# Patient Record
Sex: Female | Born: 1975
Health system: Southern US, Community
[De-identification: ages and names within clinical notes are randomized; demographics above are authoritative.]

## PROBLEM LIST (undated history)

## (undated) DIAGNOSIS — F32A Depression, unspecified: Secondary | ICD-10-CM

## (undated) DIAGNOSIS — G35 Multiple sclerosis: Secondary | ICD-10-CM

## (undated) DIAGNOSIS — F329 Major depressive disorder, single episode, unspecified: Secondary | ICD-10-CM

## (undated) DIAGNOSIS — G35D Multiple sclerosis, unspecified: Secondary | ICD-10-CM

## (undated) HISTORY — DX: Multiple sclerosis, unspecified: G35.D

## (undated) HISTORY — DX: Depression, unspecified: F32.A

## (undated) HISTORY — DX: Multiple sclerosis: G35

## (undated) HISTORY — DX: Major depressive disorder, single episode, unspecified: F32.9

## (undated) HISTORY — PX: LIPOSUCTION: SHX10

---

## 2003-09-08 ENCOUNTER — Other Ambulatory Visit: Admission: RE | Admit: 2003-09-08 | Discharge: 2003-09-08 | Payer: Self-pay | Admitting: Family Medicine

## 2004-10-04 ENCOUNTER — Other Ambulatory Visit: Admission: RE | Admit: 2004-10-04 | Discharge: 2004-10-04 | Payer: Self-pay | Admitting: Family Medicine

## 2004-11-16 ENCOUNTER — Encounter: Admission: RE | Admit: 2004-11-16 | Discharge: 2004-11-16 | Payer: Self-pay | Admitting: Family Medicine

## 2004-12-02 ENCOUNTER — Encounter: Admission: RE | Admit: 2004-12-02 | Discharge: 2004-12-02 | Payer: Self-pay | Admitting: Neurology

## 2006-08-11 ENCOUNTER — Inpatient Hospital Stay (HOSPITAL_COMMUNITY): Admission: AD | Admit: 2006-08-11 | Discharge: 2006-08-13 | Payer: Self-pay | Admitting: *Deleted

## 2010-12-23 ENCOUNTER — Encounter: Payer: Self-pay | Admitting: Family Medicine

## 2011-02-19 ENCOUNTER — Other Ambulatory Visit: Payer: Self-pay | Admitting: Obstetrics & Gynecology

## 2011-02-19 DIAGNOSIS — N83299 Other ovarian cyst, unspecified side: Secondary | ICD-10-CM

## 2011-02-19 DIAGNOSIS — N83209 Unspecified ovarian cyst, unspecified side: Secondary | ICD-10-CM

## 2011-02-21 ENCOUNTER — Ambulatory Visit: Payer: Self-pay | Admitting: Family Medicine

## 2011-02-23 NOTE — Assessment & Plan Note (Signed)
NAME:  Laurie Cole, Laurie Cole              ACCOUNT NO.:  [id number]  MEDICAL RECORD NO.:  [id number]           PATIENT TYPE:  LOCATION:  [facility] at [place]           FACILITY:  PHYSICIAN:  Maylon Cos, CNM         DATE OF BIRTH:  DATE OF SERVICE:  02/19/2011                                 CLINIC NOTE  REASON FOR TODAY'S VISIT:  Followup of diagnosis of left ovarian cyst and to establish primary care provider.  HISTORY OF PRESENT ILLNESS:  The patient was seen in the emergency department at [facility] on February 14, 2011, secondary to complaints of pelvic pain and left lower quadrant pain.  She had a pelvic ultrasound at that time that showed a left ovarian cyst measuring 1.8 cm, also of significant amount on ultrasound she had a small fibroid in the left posterior fundus of the uterus measuring 2 cm and one in the right posterior fundus measuring 1.24 cm.  Endometrial stripe was 1 cm, within normal limits.  All of her labs were found to be within normal limits.  She was given prescription for Vicodin, which she has not taken any and told to follow up with her primary care provider.  She presents today with similar complaints that she notices more with movement or palpation.  Her activity over the weekend has been limited due to concerns of pain with exercise and intercourse, so she has not been involved in these activities.  The patient states that she has known about the small fibroid for quite some time, however, had never been diagnosed with an ovarian cyst.  MEDICAL HISTORY:  Allergies:  She has no known drug allergies.  Current medications are listed in her chart.  Health care maintenance:  She is up-to-date on all of her vaccinations.  Menstrual history:  First day of her last menstrual period was January 23, 2011.  Menarche began at age 70.  She has a regular cycles.  Her periods last 7-12 days, she describes them as a medium to light flow. She has mild  amount of pain and bleeding in between her periods.  Contraceptive history:  Her husband has not had a vasectomy.  Obstetrical history:  She is a gravida 1, para 1-0-0-1.  She has a son that is 66-1/2 years old, uncomplicated pregnancy and vaginal delivery.  Gynecologic history:  Her last Pap smear was in October 2011, it was normal.  She has a history of an abnormal Pap smear in 2001 without cryo, LEEP, or colpo.  It was treated with repeat Pap.  They had all been normal since.  STD History:  Negative.  Personal medical history:  Positive for irritable bowel syndrome and multiple sclerosis.  She was diagnosed with MS approximately 6 years ago and has been under the care of [facility].  SURGICAL HISTORY:  Negative.  SOCIAL HISTORY:  She is married.  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  She lives with her husband her son.  She is a nonsmoker.  She is a social drinker drinking less than 1 alcoholic beverage per week.  She denies any illicit drug use, sexual abuse, or physical  abuse.  She has had one partner in the last year.  FAMILY HISTORY:  Positive for diabetes in her maternal grandfather.  REVIEW OF SYSTEMS:  Systemic review is negative other than what has already been reviewed in HPI.  PHYSICAL EXAMINATION:  GENERAL:  This is a pleasant Caucasian female who appears to be her stated age of 35.  She is in no apparent distress. HEENT:  Grossly normal with good dentition. ABDOMEN:  Soft and nontender without masses.  No hepatosplenomegaly. PELVIC:  Bimanual exam reveals nonenlarged and nontender uterus, nonenlarged and nontender adnexa.  ASSESSMENT: 1. Left ovarian cyst. 2. Small uterine fibroids. 3. Multiple sclerosis.  PLAN: 1. Treatment options have been discussed with the patient for ovarian     cysts including expectant management with no treatment versus oral     contraceptive pills.  Given the  patient's long menses, she is     interested in oral contraceptive pills and we will start those with     her next cycle. 2. Repeat ultrasound in 6-8 weeks for resolution of her ovarian cyst.     The patient should follow up in approximately 1 week after her     ultrasound to review her results and as well as her symptoms and     cycles on her Loestrin 24.  She has been given a sample of pill     pack today along with a prescription with refills. 3. Pain management.  She is not interested in narcotic pain     medications.  She states that she tore up the prescription that was     given in the hospital.  Give her a prescription for diclofenac 75     mg 1 p.o. b.i.d. as needed for pain.  The patient should follow up     in approximately 1 week after her repeat pelvic ultrasound in 6     weeks or p.r.n. problems.          ______________________________ Maylon Cos, CNM    SS/MEDQ  D:  02/19/2011  T:  02/20/2011  Job:  [id number]

## 2011-03-14 ENCOUNTER — Ambulatory Visit: Payer: Self-pay | Admitting: Family Medicine

## 2011-03-21 ENCOUNTER — Ambulatory Visit (INDEPENDENT_AMBULATORY_CARE_PROVIDER_SITE_OTHER): Payer: PRIVATE HEALTH INSURANCE | Admitting: Family Medicine

## 2011-03-21 ENCOUNTER — Encounter: Payer: Self-pay | Admitting: Family Medicine

## 2011-03-21 DIAGNOSIS — R5383 Other fatigue: Secondary | ICD-10-CM

## 2011-03-21 DIAGNOSIS — Z130 Encounter for screening for diseases of the blood and blood-forming organs and certain disorders involving the immune mechanism: Secondary | ICD-10-CM

## 2011-03-21 DIAGNOSIS — Z1329 Encounter for screening for other suspected endocrine disorder: Secondary | ICD-10-CM

## 2011-03-21 DIAGNOSIS — R5381 Other malaise: Secondary | ICD-10-CM

## 2011-03-21 DIAGNOSIS — G35 Multiple sclerosis: Secondary | ICD-10-CM

## 2011-03-21 DIAGNOSIS — E559 Vitamin D deficiency, unspecified: Secondary | ICD-10-CM

## 2011-03-21 DIAGNOSIS — Z13220 Encounter for screening for lipoid disorders: Secondary | ICD-10-CM

## 2011-03-21 DIAGNOSIS — G35D Multiple sclerosis, unspecified: Secondary | ICD-10-CM

## 2011-03-21 NOTE — Progress Notes (Signed)
Subjective:    Patient ID: Laurie Cole, female    DOB: [birth year], 35 y.o.   MRN: [id number]  HPI 35 yo WF presents for NOV.  She has had relapsing - remitting MS x 6 yrs now, on Tysabri per Dr Leilani Merl in [facility].  Doing well.  Mood usually well controlled by low dose of Prozac daily.  She reports that last month, she had to go to [facility] for pelvic pain and was diagnosed with an ovarian cyst.  This was her first bout with a cyst and she had not previously been on birth control.  Her periods are usually normal and her husband had a vasectomy.  She went to gyn down the hall and was put on Loestrin to prevent new ovarian cysts and over time, her pain did resolve.  She is set up to repeat her u/s in another wk but she feels worse on the OCPs, more moody and more tired.  Denies anything else going on such as added stressors or flare in her MS that would cause this.     BP 112/72  Pulse 77  Ht 5' 3.75" (1.619 m)  Wt 140 lb (63.504 kg)  BMI 24.22 kg/m2  SpO2 100%  LMP 02/28/2011 BP 112/72  Pulse 77  Ht 5' 3.75" (1.619 m)  Wt 140 lb (63.504 kg)  BMI 24.22 kg/m2  SpO2 100%  LMP 02/28/2011  Past Medical History  Diagnosis Date  . Depression   . MS (multiple sclerosis)     sees Dr Leilani Merl in [facility]    History reviewed. No pertinent past surgical history.  Family History  Problem Relation Age of Onset  . Drug abuse Mother   . Arthritis Father     History   Social History  . Marital Status: Married    Spouse Name: Fayrene Fearing    Number of Children: 1  . 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 Smoking status: Never Smoker   . Smokeless tobacco: Not on file  . Alcohol Use: 0.5 oz/week    1 drink(s) per week  . Drug Use: No  . Sexually Active: Yes -- Female partner(s)    Birth Control/ Protection: Surgical   Other Topics Concern  . Not on file   Social History Narrative  . No narrative on file    No Known Allergies  Current  outpatient prescriptions:amphetamine-dextroamphetamine (ADDERALL) 5 MG tablet, Take 5 mg by mouth daily.  , Disp: , Rfl: ;  cetirizine (ZYRTEC) 10 MG tablet, Take 10 mg by mouth daily.  , Disp: , Rfl: ;  FLUoxetine (PROZAC) 10 MG capsule, Take 10 mg by mouth daily.  , Disp: , Rfl: ;  fluticasone (FLONASE) 50 MCG/ACT nasal spray, 2 sprays by Nasal route daily.  , Disp: , Rfl: ;  Natalizumab (TYSABRI IV), Inject into the vein.  , Disp: , Rfl:  Norethindrone Acet-Ethinyl Est (LOESTRIN 1.5/30, 21,) 1.5-30 MG-MCG TABS, Take by mouth.  , Disp: , Rfl:     Review of Systems  Constitutional: Positive for fatigue. Negative for fever and unexpected weight change.  HENT: Negative for congestion.   Respiratory: Negative for shortness of breath.   Cardiovascular: Negative for chest pain, palpitations and leg swelling.  Gastrointestinal: Negative for nausea, vomiting, abdominal pain, diarrhea and constipation.  Genitourinary: Negative for pelvic pain.  Psychiatric/Behavioral: Positive for dysphoric mood. Negative for sleep disturbance. The patient is nervous/anxious.  Objective:   Physical Exam  Constitutional: She appears well-developed and well-nourished. No distress.  HENT:  Head: Normocephalic and atraumatic.  Eyes: Conjunctivae are normal.  Neck: Neck supple. No thyromegaly present.  Cardiovascular: Normal rate, regular rhythm and normal heart sounds.   No murmur heard. Pulmonary/Chest: Effort normal.  Abdominal: Soft. Bowel sounds are normal. There is no tenderness.  Musculoskeletal: She exhibits no edema.  Lymphadenopathy:    She has no cervical adenopathy.  Skin: Skin is warm and dry.  Psychiatric: She has a normal mood and affect.          Assessment & Plan:  Fatigue - possibly from new start OCPs.  Will complete this wk and then STOP them.  We discussed that she may have another cyst OFF the pills but since she had previously never had an ovarian cyst, she opts to watch and  can use Advil and heating pad for mild to moderate pelvic pain in the future.  She is to f/u for her repeat u/s as scheduled and will look for fatigue and irritability to resolve off the pills.  Update labs to r/o other causes.

## 2011-03-21 NOTE — Patient Instructions (Signed)
Update fasting labs downstairs one morning.   Will call you w/ results.  Stop Birth control pills.

## 2011-03-28 LAB — CBC WITH DIFFERENTIAL/PLATELET
HCT: 37.5 % (ref 36.0–46.0)
Hemoglobin: 13.1 g/dL (ref 12.0–15.0)
Lymphocytes Relative: 47 % — ABNORMAL HIGH (ref 12–46)
Lymphs Abs: 3.4 10*3/uL (ref 0.7–4.0)
MCH: 34.4 pg — ABNORMAL HIGH (ref 26.0–34.0)
MCHC: 35.1 g/dL (ref 30.0–36.0)
MCV: 98.2 fL (ref 78.0–100.0)
Monocytes Absolute: 0.4 10*3/uL (ref 0.1–1.0)
Monocytes Relative: 5 % (ref 3–12)
Neutro Abs: 3.4 10*3/uL (ref 1.7–7.7)
Neutrophils Relative %: 48 % (ref 43–77)
Platelets: 215 10*3/uL (ref 150–400)
RBC: 3.81 MIL/uL — ABNORMAL LOW (ref 3.87–5.11)
RDW: 13.8 % (ref 11.5–15.5)
WBC: 7.1 10*3/uL (ref 4.0–10.5)

## 2011-03-29 ENCOUNTER — Telehealth: Payer: Self-pay | Admitting: Family Medicine

## 2011-03-29 LAB — COMPLETE METABOLIC PANEL WITH GFR
Albumin: 4.5 g/dL (ref 3.5–5.2)
BUN: 8 mg/dL (ref 6–23)
CO2: 25 mEq/L (ref 19–32)
GFR, Est African American: >60 mL/min (ref >60)
GFR, Est Non African American: >60 mL/min (ref >60)
Glucose, Bld: 86 mg/dL (ref 70–99)
Sodium: 139 mEq/L (ref 135–145)
Total Bilirubin: 0.8 mg/dL (ref 0.3–1.2)
Total Protein: 6.9 g/dL (ref 6.0–8.3)

## 2011-03-29 LAB — LIPID PANEL
Cholesterol: 206 mg/dL — ABNORMAL HIGH (ref 0–200)
HDL: 52 mg/dL (ref >39)

## 2011-03-29 LAB — TSH: TSH: 2.232 u[IU]/mL (ref 0.350–4.500)

## 2011-03-29 NOTE — Telephone Encounter (Signed)
Pt aware of the above  

## 2011-03-29 NOTE — Telephone Encounter (Signed)
Pls let pt know that her blood counts, thyroid function, vitamin D level, fasting sugar, liver and kidney function came back normal.  Cholesterol looks ok.  Ferritin is elevated -- this can be high with inflammation.  Fax copy to her neurologist in [place] -- Dr Leilani Merl.  Have her come in to repeat ferritin with iron, TIBC in 3 wks (lab only)

## 2011-04-02 ENCOUNTER — Other Ambulatory Visit: Payer: PRIVATE HEALTH INSURANCE

## 2011-04-09 ENCOUNTER — Ambulatory Visit: Payer: PRIVATE HEALTH INSURANCE

## 2011-05-28 ENCOUNTER — Ambulatory Visit
Admission: RE | Admit: 2011-05-28 | Discharge: 2011-05-28 | Disposition: A | Discharge status: Home / Self Care | Payer: PRIVATE HEALTH INSURANCE | Source: Ambulatory Visit | Attending: Obstetrics & Gynecology | Admitting: Obstetrics & Gynecology

## 2011-05-28 DIAGNOSIS — N83299 Other ovarian cyst, unspecified side: Secondary | ICD-10-CM

## 2011-05-29 ENCOUNTER — Ambulatory Visit (INDEPENDENT_AMBULATORY_CARE_PROVIDER_SITE_OTHER): Payer: PRIVATE HEALTH INSURANCE | Admitting: Obstetrics & Gynecology

## 2011-05-29 DIAGNOSIS — N803 Endometriosis of pelvic peritoneum: Secondary | ICD-10-CM

## 2011-05-29 DIAGNOSIS — N949 Unspecified condition associated with female genital organs and menstrual cycle: Secondary | ICD-10-CM

## 2011-05-30 ENCOUNTER — Other Ambulatory Visit: Payer: PRIVATE HEALTH INSURANCE

## 2011-05-30 NOTE — Assessment & Plan Note (Signed)
NAME:  Laurie Cole, Laurie Cole              ACCOUNT NO.:  [id number]  MEDICAL RECORD NO.:  [id number]           PATIENT TYPE:  LOCATION:  [facility] [place]           FACILITY:  PHYSICIAN:  Elsie Lincoln, MD           DATE OF BIRTH:  DATE OF SERVICE:  05/29/2011                                 CLINIC NOTE  The patient is a 35 year old female who presents for followup of pelvic pain.  The pain is mostly in the lower left quadrant.  She had a sonogram done at the ER in [place] at the [facility] and her ultrasound was essentially normal.  There were uterus measures 8.4 x 3.9 x 4.8 cm contains with a 2-cm left posterior fundal fibroid and 1.4 cm right posterior fundal fibroid.  Her endometrial stripe was 1 cm.  The right ovary was 3.2 x 1.7 x 2.9, the left ovary was 3.7 x 1.8 x 4.3, and showed a 1.8 cm follicular cyst which would be normal.  The patient had a followup ultrasound done at [facility] [place], but however, was not read by one of our GYN [facility].  The uterus measures 9.2 x 4.3 x 6.5.  Endometrium 5.2 mm.  No fibroids noted.  Right ovary measures 3.8 x 2.2 x 1.8 with several small follicles noted.  There is a small parovarian/exophytic cyst or follicle measuring 2.4 x 1.3 x 1.9.  Left ovary measures 2.9 x 1.2 x 3.3.  There is a small parovarian/exophytic cyst or follicle measuring 1.5 x 0.9 x 1.0.  No suspicion adnexal findings.  I am slightly confused about the differential of the paraovarian/exophytic cyst or follicle.  I am going to have Dr. Kyung Rudd and Dr. Avel Peace to re-read this and also compared to her films from the [facility] visit.  I will call the patient with those results.  The patient just started back on oral contraceptives.  I told her that this will be good to suppress ovarian cyst, also treatment for endometriosis.  She does have a complaint of constipation and she has irritable bowel syndrome and is more constipated than diarrhea.   She needs to get back involve with the gastroenterologist, I recommend the one in [place] the gastro  specialists.  She will be just check with her insurance.  I recommended that she takes MiraLax every day and does become completely regular and continue the oral contraceptives.  PAST MEDICAL HISTORY:  Multiple sclerosis, irritable bowel syndrome.  OB/GYN HISTORY:  As above and no other changes since her last visit in March.  MENSTRUAL HISTORY:  She did have some irregular bleeding with certain oral contraceptives.  MEDICATIONS:  See med rec.  No changes since March visit.  SURGICAL HISTORY:  Negative.  ALLERGIES:  None.  PHYSICAL EXAMINATION:  VITAL SIGNS:  Pulse 86, blood pressure 110/74, weight 141. GENERAL:  Well-nourished, well-developed, in no apparent distress. HEENT:  Normocephalic, atraumatic. CHEST:  Normal movement. ABDOMEN:  Soft, nontender.  No rebound or guarding. GENITALIA:  Tanner V.  Vagina pink, normal rugae.  Cervix closed, nontender.  Uterus anteverted, nontender.  Vaginal sidewalls nontender. No cystocele, no rectocele, and no urethrocele.  No pain over the bladder.  No pain over the rectum.  No pain on the uterus.  Right ovary is easily palpated, nontender.  Left ovary is slightly more difficult to palpate, but once found she is not tender over this area.  If we pushed towards the pelvic sidewall up into the left apex of the vagina, she is very tender in this area.  No stool saw on the rectum.  ASSESSMENT/PLAN:  A 35 year old female with left-sided pelvic pain. 1. Continue oral contraceptives. 2. We will get films from [facility] and have the radiologist to     compare this to the one done on May 28, 2011 at [facility]. 3. Discussed diagnosis of endometriosis. 4. It is still not better in 2-3 months. 5. Continue pelvic physical therapy.          ______________________________ Elsie Lincoln, MD    KL/MEDQ  D:  05/29/2011  T:  05/30/2011   Job:  [id number]

## 2011-08-08 ENCOUNTER — Telehealth: Payer: Self-pay | Admitting: *Deleted

## 2011-08-08 DIAGNOSIS — Z130 Encounter for screening for diseases of the blood and blood-forming organs and certain disorders involving the immune mechanism: Secondary | ICD-10-CM

## 2011-08-08 NOTE — Telephone Encounter (Signed)
Labs ordered.

## 2011-08-17 ENCOUNTER — Encounter: Payer: Self-pay | Admitting: Family Medicine

## 2011-08-17 ENCOUNTER — Ambulatory Visit (INDEPENDENT_AMBULATORY_CARE_PROVIDER_SITE_OTHER): Payer: PRIVATE HEALTH INSURANCE | Admitting: Family Medicine

## 2011-08-17 DIAGNOSIS — K589 Irritable bowel syndrome without diarrhea: Secondary | ICD-10-CM

## 2011-08-17 DIAGNOSIS — G35 Multiple sclerosis: Secondary | ICD-10-CM | POA: Insufficient documentation

## 2011-08-17 NOTE — Assessment & Plan Note (Signed)
Dong well on her miralax regimen. If not consider amitiza.

## 2011-08-17 NOTE — Progress Notes (Signed)
  Subjective:    Patient ID: Laurie Cole, female    DOB: July 05, 1976, 35 y.o.   MRN: [id number]  HPI She was new pt with Dr. Cathey Cole and saw her for stomach issues.  Pain was in the LLQ. Saw Dr. Penne Cole.  Possible endometriosis.  She has been eating well and taking miralax every day adn that seem to be really helping her.  Tends to have IBS- constipation predominant.     Review of Systems     BP 113/63  Pulse 78  Wt 141 lb (63.957 kg)    No Known Allergies  Past Medical History  Diagnosis Date  . Depression   . MS (multiple sclerosis)     sees Dr Laurie Cole in [facility]    No past surgical history on file.  History   Social History  . Marital Status: Married    Spouse Name: Laurie Cole    Number of Children: 1  . 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 Smoking status: Never Smoker   . Smokeless tobacco: Not on file  . Alcohol Use: 0.5 oz/week    1 drink(s) per week  . Drug Use: No  . Sexually Active: Yes -- Female partner(s)    Birth Control/ Protection: Surgical   Other Topics Concern  . Not on file   Social History Narrative  . No narrative on file    Family History  Problem Relation Age of Onset  . Drug abuse Mother   . Arthritis Father     Ms. Laurie Cole does not currently have medications on file.  Objective:   Physical Exam  Constitutional: She is oriented to person, place, and time. She appears well-developed and well-nourished.  HENT:  Head: Normocephalic and atraumatic.  Cardiovascular: Normal rate, regular rhythm and normal heart sounds.   Pulmonary/Chest: Effort normal and breath sounds normal.  Neurological: She is alert and oriented to person, place, and time.  Skin: Skin is warm and dry.  Psychiatric: She has a normal mood and affect. Her behavior is normal.          Assessment & Plan:  Left lower quadrant pain-based on history I do think it is consistent with irritable bowel syndrome, and a  endometriosis is certainly a possibility. She is doing well on her current regimen with MiraLax. I suggested she continue this. If she starts to have further problems we can consider gastroenterology referral for possible colonoscopy but I think her symptoms seem to be under control and do not indicate anything more such as colon cancer et Karie Soda. Also we could consider, Amitiza for her irritable bowel syndrome, as she did well with Zelnorm in the past.

## 2011-08-17 NOTE — Patient Instructions (Signed)
Recheck Ferritin in about 6 months.

## 2011-10-08 ENCOUNTER — Other Ambulatory Visit: Payer: Self-pay | Admitting: *Deleted

## 2011-10-08 MED ORDER — FLUTICASONE PROPIONATE 50 MCG/ACT NA SUSP: 2.0000 | Freq: Every day | NASAL | Status: DC

## 2012-04-29 ENCOUNTER — Ambulatory Visit: Payer: PRIVATE HEALTH INSURANCE | Admitting: Obstetrics & Gynecology

## 2012-04-29 DIAGNOSIS — Z0142 Encounter for cervical smear to confirm findings of recent normal smear following initial abnormal smear: Secondary | ICD-10-CM

## 2012-07-26 ENCOUNTER — Other Ambulatory Visit: Payer: Self-pay | Admitting: Family Medicine

## 2015-01-13 ENCOUNTER — Ambulatory Visit: Payer: Self-pay | Admitting: Neurology

## 2015-01-14 ENCOUNTER — Ambulatory Visit: Payer: Self-pay | Admitting: Neurology

## 2015-01-14 ENCOUNTER — Ambulatory Visit (INDEPENDENT_AMBULATORY_CARE_PROVIDER_SITE_OTHER): Payer: BLUE CROSS/BLUE SHIELD | Admitting: Neurology

## 2015-01-14 ENCOUNTER — Encounter: Payer: Self-pay | Admitting: Neurology

## 2015-01-14 VITALS — BP 110/64 | HR 62 | Resp 14 | Ht 63.0 in | Wt 145.0 lb

## 2015-01-14 DIAGNOSIS — G35 Multiple sclerosis: Secondary | ICD-10-CM

## 2015-01-14 DIAGNOSIS — F329 Major depressive disorder, single episode, unspecified: Secondary | ICD-10-CM

## 2015-01-14 DIAGNOSIS — R5383 Other fatigue: Secondary | ICD-10-CM | POA: Insufficient documentation

## 2015-01-14 DIAGNOSIS — E559 Vitamin D deficiency, unspecified: Secondary | ICD-10-CM

## 2015-01-14 DIAGNOSIS — F32A Depression, unspecified: Secondary | ICD-10-CM | POA: Insufficient documentation

## 2015-01-14 DIAGNOSIS — Z79899 Other long term (current) drug therapy: Secondary | ICD-10-CM

## 2015-01-14 MED ORDER — FLUOXETINE HCL 10 MG PO CAPS
10.0000 mg | ORAL_CAPSULE | Freq: Every day | ORAL | Status: DC
Start: 2015-01-14 — End: 2015-05-19

## 2015-01-14 MED ORDER — AMPHETAMINE-DEXTROAMPHETAMINE 10 MG PO TABS: 10.0000 mg | ORAL_TABLET | Freq: Two times a day (BID) | ORAL | Status: DC

## 2015-01-14 NOTE — Progress Notes (Signed)
[facility]  PATIENT: Laurie Cole DOB: 1976-02-08  REFERRING CLINICIAN: Loyal Gambler HISTORY FROM: Patient REASON FOR VISIT: MS   HISTORICAL  CHIEF COMPLAINT:  Chief Complaint  Patient presents with  . Multiple Sclerosis    Sts. spasticity right buttock has been some worse.  Has been under more stress with job change, husband's job change./fim    HISTORY OF PRESENT ILLNESS:  Laurie Cole is a 39 year old woman who presented with a Lhermite sign, visual changes and numbness in 2006. MRIs were performed that were consistent with multiple sclerosis. Additionally, visual evoked potentials showed slowing of the P100. She was placed on Betaseron. She stopped when she was pregnant about 2 years later. She returned to Betaseron for about another year and one half after her son was born. Then she had some breakthrough relapses and she was switched to Tysabri and tolerated it very well. Unfortunately, after a couple years she converted from negative to positive at a high titer.   She does not have any exacerbations while on Tysabri. She then switched to Tecfidera. She's been on Tecfidera about 3 years now and has tolerated it well. She has not had any exacerbations while on it. MRIs have been stable.   She has generally done well with her MS over the last decade. However, she does have some symptoms including moderate to severe fatigue.  Her fatigue is both physical and cognitive. Her fatigue gets worse as the day goes on, most of the time. She has some heat intolerance with more fatigue. She takes Adderall 10 mg by mouth twice a day with benefit. She denies any side effects.  She denies any significant issues with gait, numbness, weakness or vision. She has no bladder urgency or frequency.  She denies any significant cognitive issues. There are no current mood issues. She has been under a lot of stress with changing jobs lately but has handled this well with lots of  activities anymore time with her family.  She notes little bit of tightness in the right hip but does not report spasticity elsewhere.  She had a mild vitamin D deficiency in the past and continues to take supplements.  REVIEW OF SYSTEMS:  Constitutional: No fevers, chills, sweats, or change in appetite Eyes: No visual changes, double vision, eye pain Ear, nose and throat: No hearing loss, ear pain, nasal congestion, sore throat Cardiovascular: No chest pain, palpitations Respiratory:  No shortness of breath at rest or with exertion.   No wheezes GastrointestinaI: No nausea, vomiting, diarrhea, abdominal pain, fecal incontinence Genitourinary:  No dysuria, urinary retention or frequency.  No nocturia. Musculoskeletal:  No neck pain, back pain.   Reports a muscle spasm sensation in the right hip. Integumentary: No rash, pruritus, skin lesions Neurological: as above Psychiatric: No depression at this time.  No anxiety Endocrine: No palpitations, diaphoresis, change in appetite, change in weigh or increased thirst Hematologic/Lymphatic:  No anemia, purpura, petechiae. Allergic/Immunologic: No itchy/runny eyes, nasal congestion, recent allergic reactions, rashes  ALLERGIES: No Known Allergies  HOME MEDICATIONS:  Current outpatient prescriptions:  .  amphetamine-dextroamphetamine (ADDERALL) 10 MG tablet, Take 1 tablet (10 mg total) by mouth 2 (two) times daily., Disp: 60 tablet, Rfl: 0 .  cetirizine (ZYRTEC) 10 MG tablet, Take 10 mg by mouth daily.  , Disp: , Rfl:  .  FLUoxetine (PROZAC) 10 MG capsule, Take 1 capsule (10 mg total) by mouth daily., Disp: 30 capsule, Rfl: 11 .  TECFIDERA 240 MG CPDR, , Disp: ,  Rfl: 2 .  fluticasone (FLONASE) 50 MCG/ACT nasal spray, USE TWO SPRAYS IN EACH NOSTRIL DAILY (Patient not taking: Reported on 01/14/2015), Disp: 16 g, Rfl: 2   PAST MEDICAL HISTORY: Past Medical History  Diagnosis Date  . Depression   . MS (multiple sclerosis)     sees Dr Kerman Passey  in [facility]    PAST SURGICAL HISTORY: History reviewed. No pertinent past surgical history.  FAMILY HISTORY: Family History  Problem Relation Age of Onset  . Drug abuse Mother   . Arthritis Father   . Arthritis/Rheumatoid Father   . Seizures Maternal Aunt   . Parkinsonism Maternal Grandfather   . Lupus Maternal Aunt     SOCIAL HISTORY:  History   Social History  . Marital Status: Married    Spouse Name: Laurie Cole  . Number of Children: 1  . Years of Education: 37   Occupational History  . Education officer, museum    Social History Main Topics  . Smoking status: Never Smoker   . Smokeless tobacco: Not on file  . Alcohol Use: 0.5 oz/week    1 drink(s) per week  . Drug Use: No  . Sexual Activity:    Partners: Male    Birth Control/ Protection: Surgical   Other Topics Concern  . Not on file   Social History Narrative     PHYSICAL EXAM  Filed Vitals:   01/14/15 0854  BP: 110/64  Pulse: 62  Resp: 14  Height: 5\' 3"  (1.6 m)  Weight: 145 lb (65.772 kg)    Body mass index is 25.69 kg/(m^2).   General: The patient is well-developed and well-nourished and in no acute distress  Eyes:  Funduscopic exam shows normal optic discs and retinal vessels.  Neck: The neck is supple, no carotid bruits are noted.  The neck is nontender.  Respiratory: The respiratory examination is clear.  Cardiovascular: The cardiovascular examination reveals a regular rate and rhythm, no murmurs, gallops or rubs are noted.  Skin: Extremities are without significant edema.  Neurologic Exam  Mental status: The patient is alert and oriented x 3 at the time of the examination. The patient has apparent normal recent and remote memory, with an apparently normal attention span and concentration ability.   Speech is normal.  Cranial nerves: Extraocular movements are full. Pupils are equal, round, and reactive to light and accomodation.  Visual fields are full.  Facial symmetry is present. There is good facial  sensation to soft touch bilaterally.Facial strength is normal.  Trapezius and sternocleidomastoid strength is normal. No dysarthria is noted.  The tongue is midline, and the patient has symmetric elevation of the soft palate. No obvious hearing deficits are noted.  Motor:  Muscle bulk and tone are normal. Strength is  5 / 5 in all 4 extremities.   Sensory: Sensory testing is intact to pinprick, soft touch, vibration sensation, and position sense on all 4 extremities.  Coordination: Cerebellar testing reveals good finger-nose-finger and heel-to-shin bilaterally.  Gait and station: Station and gait are normal. Tandem gait is normal. Romberg is negative.   Reflexes: Deep tendon reflexes are symmetric and normal bilaterally. Plantar responses are normal.    DIAGNOSTIC DATA (LABS, IMAGING, TESTING) - I reviewed patient records, labs, notes, testing and imaging myself where available.     ASSESSMENT AND PLAN  Multiple sclerosis - Plan: Cd4/cd8 (t-helper/t-suppressor cell), CBC with Differential, Hepatic Function Panel, Vitamin D, 25-hydroxy, MR Brain W Wo Contrast  Other fatigue - Plan: Cd4/cd8 (t-helper/t-suppressor cell), CBC with  Differential, Hepatic Function Panel, Vitamin D, 25-hydroxy, MR Brain W Wo Contrast  High risk medication use - Plan: Cd4/cd8 (t-helper/t-suppressor cell), CBC with Differential, Hepatic Function Panel, Vitamin D, 25-hydroxy, MR Brain W Wo Contrast  Vitamin D deficiency - Plan: Vitamin D, 25-hydroxy  MS (multiple sclerosis)  Depression  In summary, Laurie Cole is a 39 year old woman who has had multiple sclerosis for 10 years and is currently doing well on Tecfidera therapy. She broke through with relapses on Betaseron and stopped Tysabri due to safety issues. Currently, her main issue is fatigue. This is helped by Adderall and I will renew her medication while she is here. Mood issues are currently controlled with activities and medication. We discussed  some safety issues with Tecfidera. I will check a CBC with differential, CD4/CD8 count and liver function tests. Additionally because she has been deficient in vitamin D in the past I will check that.   We will check an MRI of the brain with and without contrast to rule out subclinical progression. If that occurs, we will have to reconsider more efficacious medication.  She will return to see me in 5-6 months or sooner if she has new or worsening neurologic symptoms.  45 minute face-to-face office visit with greater than 50% of the time counseling and coordinating care about her MS and therapy.    Laurie Cole A. Felecia Shelling, MD, PhD 08/23/1940, 74:08 AM Certified in Neurology, Clinical Neurophysiology, Sleep Medicine, Pain Medicine and Neuroimaging  [facility] [address] [place], [place] [address] [phone]

## 2015-01-15 LAB — CBC WITH DIFFERENTIAL/PLATELET
BASOS ABS: 0 10*3/uL (ref 0.0–0.2)
Basos: 1 %
Eos: 3 %
Eosinophils Absolute: 0.2 10*3/uL (ref 0.0–0.4)
HEMATOCRIT: 40.4 % (ref 34.0–46.6)
HEMOGLOBIN: 13.6 g/dL (ref 11.1–15.9)
Immature Grans (Abs): 0 10*3/uL (ref 0.0–0.1)
Immature Granulocytes: 0 %
LYMPHS: 20 %
Lymphocytes Absolute: 1.3 10*3/uL (ref 0.7–3.1)
MCH: 33.2 pg — AB (ref 26.6–33.0)
MCHC: 33.7 g/dL (ref 31.5–35.7)
MCV: 99 fL — ABNORMAL HIGH (ref 79–97)
MONOCYTES: 7 %
MONOS ABS: 0.4 10*3/uL (ref 0.1–0.9)
NEUTROS ABS: 4.3 10*3/uL (ref 1.4–7.0)
NEUTROS PCT: 69 %
Platelets: 295 10*3/uL (ref 150–379)
RBC: 4.1 x10E6/uL (ref 3.77–5.28)
RDW: 14 % (ref 12.3–15.4)
WBC: 6.2 10*3/uL (ref 3.4–10.8)

## 2015-01-15 LAB — HEPATIC FUNCTION PANEL
ALBUMIN: 4.5 g/dL (ref 3.5–5.5)
ALK PHOS: 45 IU/L (ref 39–117)
ALT: 16 IU/L (ref 0–32)
AST: 22 IU/L (ref 0–40)
BILIRUBIN TOTAL: 0.7 mg/dL (ref 0.0–1.2)
BILIRUBIN, DIRECT: 0.21 mg/dL (ref 0.00–0.40)
Total Protein: 6.8 g/dL (ref 6.0–8.5)

## 2015-01-15 LAB — VITAMIN D 25 HYDROXY (VIT D DEFICIENCY, FRACTURES): Vit D, 25-Hydroxy: 60.4 ng/mL (ref 30.0–100.0)

## 2015-01-17 LAB — T-HELPER CELLS CD4/CD8 %
% CD 4 POS. LYMPH.: 56.6 % (ref 30.8–58.5)
Absolute CD 4 Helper: 679 /uL (ref 359–1519)
BASOS: 1 %
Basophils Absolute: 0 10*3/uL (ref 0.0–0.2)
CD3+CD4+ CELLS/CD3+CD8+ CELLS BLD: 4.16 — AB (ref 0.92–3.72)
CD3+CD8+ CELLS # BLD: 163 /uL (ref 109–897)
CD3+CD8+ Cells NFr Bld: 13.6 % (ref 12.0–35.5)
Eos: 2 %
Eosinophils Absolute: 0.1 10*3/uL (ref 0.0–0.4)
HCT: 38.8 % (ref 34.0–46.6)
Hemoglobin: 13.4 g/dL (ref 11.1–15.9)
IMMATURE GRANULOCYTES: 0 %
Immature Grans (Abs): 0 10*3/uL (ref 0.0–0.1)
LYMPHS ABS: 1.2 10*3/uL (ref 0.7–3.1)
Lymphs: 20 %
MCH: 33.8 pg — AB (ref 26.6–33.0)
MCHC: 34.5 g/dL (ref 31.5–35.7)
MCV: 98 fL — AB (ref 79–97)
MONOS ABS: 0.4 10*3/uL (ref 0.1–0.9)
Monocytes: 7 %
NEUTROS ABS: 4.2 10*3/uL (ref 1.4–7.0)
NEUTROS PCT: 70 %
Platelets: 257 10*3/uL (ref 150–379)
RBC: 3.96 x10E6/uL (ref 3.77–5.28)
RDW: 13.7 % (ref 12.3–15.4)
WBC: 6 10*3/uL (ref 3.4–10.8)

## 2015-01-18 ENCOUNTER — Telehealth: Payer: Self-pay | Admitting: *Deleted

## 2015-01-18 NOTE — Telephone Encounter (Signed)
Spoke with Asiah and per RAS, advised her of normal labs.  She verbalized understanding of same/fim

## 2015-01-28 ENCOUNTER — Ambulatory Visit
Admission: RE | Admit: 2015-01-28 | Discharge: 2015-01-28 | Disposition: A | Discharge status: Home / Self Care | Payer: BLUE CROSS/BLUE SHIELD | Source: Ambulatory Visit | Attending: Neurology | Admitting: Neurology

## 2015-01-28 DIAGNOSIS — G35 Multiple sclerosis: Secondary | ICD-10-CM

## 2015-01-28 DIAGNOSIS — Z79899 Other long term (current) drug therapy: Secondary | ICD-10-CM

## 2015-01-28 DIAGNOSIS — R5383 Other fatigue: Secondary | ICD-10-CM

## 2015-01-28 MED ORDER — GADOBENATE DIMEGLUMINE 529 MG/ML IV SOLN: 13.0000 mL | Freq: Once | INTRAVENOUS | Status: AC | PRN

## 2015-01-31 ENCOUNTER — Telehealth: Payer: Self-pay | Admitting: Neurology

## 2015-01-31 NOTE — Telephone Encounter (Signed)
Left Message:   MRI looks good .   Nothing new and no changes

## 2015-02-02 ENCOUNTER — Other Ambulatory Visit: Payer: Self-pay | Admitting: *Deleted

## 2015-02-02 ENCOUNTER — Telehealth: Payer: Self-pay

## 2015-02-02 ENCOUNTER — Encounter: Payer: Self-pay | Admitting: Neurology

## 2015-02-02 MED ORDER — AMPHETAMINE-DEXTROAMPHETAMINE 10 MG PO TABS: 10.0000 mg | ORAL_TABLET | Freq: Two times a day (BID) | ORAL | Status: DC

## 2015-02-02 NOTE — Telephone Encounter (Signed)
I called the patient to obtain new ins ID#.  Got no answer.  Left message.

## 2015-02-02 NOTE — Telephone Encounter (Signed)
Called patient to inform Rx ready for pick up at front desk. No answer. Left vmail.

## 2015-02-03 ENCOUNTER — Encounter: Payer: Self-pay | Admitting: Neurology

## 2015-02-07 ENCOUNTER — Telehealth: Payer: Self-pay | Admitting: Neurology

## 2015-02-07 NOTE — Telephone Encounter (Signed)
Ins has been contacted and provided with clinical info.  Request is currently under review.  I caleld back. Got no answer.  Left message.

## 2015-02-07 NOTE — Telephone Encounter (Signed)
Pt is calling stating she needs prior Auth on TECFIDERA 240 MG CPDR. 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  Please call and advise.

## 2015-02-09 ENCOUNTER — Telehealth: Payer: Self-pay

## 2015-02-09 NOTE — Telephone Encounter (Signed)
[facility] has approved the request for coverage on Tecfidera effective until 06/77/0340 or until the policy changes or is terminated.   Ref PA# [id number]

## 2015-02-20 ENCOUNTER — Encounter: Payer: Self-pay | Admitting: Neurology

## 2015-02-21 ENCOUNTER — Telehealth: Payer: Self-pay | Admitting: Neurology

## 2015-02-21 NOTE — Telephone Encounter (Signed)
Ins has been contacted and provided with clinical info.  Ref KeyJosem Kaufmann.  I called back to advise.

## 2015-02-21 NOTE — Telephone Encounter (Signed)
Patient stated PA is needed for Rx amphetamine-dextroamphetamine (ADDERALL) 10 MG tablet per [facility].  [facility] ID # [id number] and group # [id number], telephone # [phone].  Please call and advise.

## 2015-02-22 NOTE — Telephone Encounter (Signed)
Prior Josem Kaufmann has been approved.  I spoke with the patient.  She is aware.

## 2015-02-22 NOTE — Telephone Encounter (Signed)
I have spoken with the patient and explained her ins restrictions with this drug.  After submitting the PA request, they did reply saying a documented diagnosis of ADD, ADHD, Narcolepsy (confirmed by sleep study) or Binge Eating Disorder was required.  She says she may pay cash for this med if needed.  Recommended she try goodrx.com for a discount voucher.  Patient was in agreement.  Will contact us if anything further is needed.

## 2015-02-22 NOTE — Telephone Encounter (Signed)
Faith reviewed notes from previous office and stated:   Faith I Misenheimer, RN  Yates Decamp, CPHT           Laurie Cole does have a hx. of ADD assoc. with MS/fim

## 2015-02-23 ENCOUNTER — Telehealth: Payer: Self-pay

## 2015-02-23 NOTE — Telephone Encounter (Signed)
[employer] has approved the request for coverage on Adderall effective until 13/24/4010 or until the policy changes or is terminated.  I spoke with the patient yesterday evening, and she is aware.  Ref [facility] [phone] SS

## 2015-03-15 ENCOUNTER — Other Ambulatory Visit: Payer: Self-pay | Admitting: *Deleted

## 2015-03-15 ENCOUNTER — Encounter: Payer: Self-pay | Admitting: Neurology

## 2015-03-15 MED ORDER — AMPHETAMINE-DEXTROAMPHETAMINE 10 MG PO TABS
10.0000 mg | ORAL_TABLET | Freq: Two times a day (BID) | ORAL | Status: DC
Start: 2015-03-15 — End: 2015-03-15

## 2015-03-15 MED ORDER — AMPHETAMINE-DEXTROAMPHETAMINE 10 MG PO TABS: 10.0000 mg | ORAL_TABLET | Freq: Two times a day (BID) | ORAL | Status: DC

## 2015-03-15 NOTE — Telephone Encounter (Signed)
Post-dated rx's provided per RAS ok, as it is difficult for Glinda to get to the office due to full-time employment/fim

## 2015-03-15 NOTE — Telephone Encounter (Signed)
Adderall r/f per my chart request/fim

## 2015-05-18 ENCOUNTER — Encounter: Payer: Self-pay | Admitting: Neurology

## 2015-05-19 ENCOUNTER — Other Ambulatory Visit: Payer: Self-pay | Admitting: *Deleted

## 2015-05-19 MED ORDER — FLUOXETINE HCL 20 MG PO TABS: 20.0000 mg | ORAL_TABLET | Freq: Every day | ORAL | Status: DC

## 2015-05-24 ENCOUNTER — Ambulatory Visit (INDEPENDENT_AMBULATORY_CARE_PROVIDER_SITE_OTHER): Payer: BLUE CROSS/BLUE SHIELD | Admitting: Neurology

## 2015-05-24 ENCOUNTER — Encounter: Payer: Self-pay | Admitting: Neurology

## 2015-05-24 VITALS — BP 124/78 | HR 72 | Resp 14 | Ht 63.0 in | Wt 144.0 lb

## 2015-05-24 DIAGNOSIS — F329 Major depressive disorder, single episode, unspecified: Secondary | ICD-10-CM | POA: Diagnosis not present

## 2015-05-24 DIAGNOSIS — Z79899 Other long term (current) drug therapy: Secondary | ICD-10-CM | POA: Diagnosis not present

## 2015-05-24 DIAGNOSIS — G35 Multiple sclerosis: Secondary | ICD-10-CM | POA: Diagnosis not present

## 2015-05-24 DIAGNOSIS — R5383 Other fatigue: Secondary | ICD-10-CM

## 2015-05-24 DIAGNOSIS — F32A Depression, unspecified: Secondary | ICD-10-CM

## 2015-05-24 NOTE — Progress Notes (Signed)
[facility]  PATIENT: Laurie Cole DOB: Apr 01, 1976  REFERRING CLINICIAN: Loyal Gambler HISTORY FROM: Patient REASON FOR VISIT: MS   HISTORICAL  CHIEF COMPLAINT:  Chief Complaint  Patient presents with  . Multiple Sclerosis    Sts. she continues to tolerate Tecfidera well.  She is having difficulty with her job and is having more depression.  She would like to discuss increasing Prozac./fim    HISTORY OF PRESENT ILLNESS:  Laurie Cole is a 39 year old woman with MS in 2006.    She notes more mood issues and has had some tearfulness in the evenings.      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      She denies any significant issues with gait, numbness, weakness or vision. She has no bladder urgency or frequency.  Fatigue/sleep:  Her fatigue gets worse as the day goes on, most of the time. She has some heat intolerance with more fatigue. She takes Adderall 10 mg by mouth twice a day with benefit. She denies any side effects.  She sleeps well.  Mood:   She has noted more depression lately.   She denies any significant cognitive issues.   She had a mild vitamin D deficiency in the past and continues to take supplements.  MS History:  who presented with a Lhermite sign, visual changes and numbness in 2006. MRIs were performed that were consistent with multiple sclerosis. Additionally, visual evoked potentials showed slowing of the P100. She was placed on Betaseron. She stopped when she was pregnant about 2 years later. She returned to Betaseron for about another year and one half after her son was born. Then she had some breakthrough relapses and she was switched to Tysabri and tolerated it very well. Unfortunately, after a couple years she converted from negative to positive at a high titer.   She does not have any exacerbations while on  Tysabri. She then switched to Tecfidera. She's been on Tecfidera about 3 years now and has tolerated it well. She has not had any exacerbations while on it. MRIs have been stable.    REVIEW OF SYSTEMS:  Constitutional: No fevers, chills, sweats, or change in appetite.  Notes  Fatigue Eyes: No visual changes, double vision, eye pain Ear, nose and throat: No hearing loss, ear pain, nasal congestion, sore throat Cardiovascular: No chest pain, palpitations Respiratory:  No shortness of breath at rest or with exertion.   No wheezes GastrointestinaI: No nausea, vomiting, diarrhea, abdominal pain, fecal incontinence Genitourinary:  No dysuria, urinary retention or frequency.  No nocturia. Musculoskeletal:  No neck pain, back pain.   Reports a muscle spasm sensation in the right hip. Integumentary: No rash, pruritus, skin lesions Neurological: as above Psychiatric: Notes  depression at this time.  No anxiety Endocrine: No palpitations, diaphoresis, change in appetite, change in weigh or increased thirst Hematologic/Lymphatic:  No anemia, purpura, petechiae. Allergic/Immunologic: No itchy/runny eyes, nasal congestion, recent allergic reactions, rashes  ALLERGIES: No Known Allergies  HOME MEDICATIONS:  Current outpatient prescriptions:  .  amphetamine-dextroamphetamine (ADDERALL) 10 MG tablet, Take 1 tablet (10 mg total) by mouth 2 (two) times daily., Disp: 180 tablet, Rfl: 0 .  cetirizine (ZYRTEC) 10 MG tablet, Take 10 mg by mouth daily.  , Disp: , Rfl:  .  FLUoxetine (PROZAC) 20 MG tablet, Take 1 tablet (20 mg total)  by mouth daily., Disp: 30 tablet, Rfl: 3 .  fluticasone (FLONASE) 50 MCG/ACT nasal spray, USE TWO SPRAYS IN EACH NOSTRIL DAILY, Disp: 16 g, Rfl: 2 .  TECFIDERA 240 MG CPDR, , Disp: , Rfl: 2   PAST MEDICAL HISTORY: Past Medical History  Diagnosis Date  . Depression   . MS (multiple sclerosis)     sees Dr Kerman Passey in [facility]    PAST SURGICAL HISTORY: History reviewed. No pertinent  past surgical history.  FAMILY HISTORY: Family History  Problem Relation Age of Onset  . Drug abuse Mother   . Arthritis Father   . Arthritis/Rheumatoid Father   . Seizures Maternal Aunt   . Parkinsonism Maternal Grandfather   . Lupus Maternal Aunt     SOCIAL HISTORY:  History   Social History  . Marital Status: Married    Spouse Name: Jeneen Rinks  . Number of Children: 1  . Years of Education: 38   Occupational History  . Education officer, museum    Social History Main Topics  . Smoking status: Never Smoker   . Smokeless tobacco: Not on file  . Alcohol Use: 0.5 oz/week    1 drink(s) per week  . Drug Use: No  . Sexual Activity:    Partners: Male    Birth Control/ Protection: Surgical   Other Topics Concern  . Not on file   Social History Narrative     PHYSICAL EXAM  Filed Vitals:   05/24/15 1537  BP: 124/78  Pulse: 72  Resp: 14  Height: 5\' 3"  (1.6 m)  Weight: 144 lb (65.318 kg)    Body mass index is 25.51 kg/(m^2).   General: The patient is well-developed and well-nourished and in no acute distress  Neurologic Exam  Mental status: The patient is alert and oriented x 3 at the time of the examination. The patient has apparent normal recent and remote memory, with an apparently normal attention span and concentration ability.   Speech is normal.  Cranial nerves: Extraocular movements are full.   Facial symmetry is present. There is good facial sensation to soft touch bilaterally.Facial strength is normal.    No dysarthria is noted.    No obvious hearing deficits are noted.  Motor:  Muscle bulk and tone are normal. Strength is  5 / 5 in all 4 extremities.   Sensory: Sensory testing is intact to touch in all 4 extremities.  Coordination: Cerebellar testing reveals good finger-nose-finger  bilaterally.  Gait and station: Station and gait are normal. Tandem gait is normal. Romberg is negative.   Reflexes: Deep tendon reflexes are symmetric and normal bilaterally.      DIAGNOSTIC DATA (LABS, IMAGING, TESTING) - I reviewed patient records, labs, notes, testing and imaging myself where available.     ASSESSMENT AND PLAN  MS (multiple sclerosis) - Plan: T-helper cells CD4/CD8 %  High risk medication use - Plan: T-helper cells CD4/CD8 %  Depression  Other fatigue  1.   Increase Prozac to 20 mg daily 2.  Check WBC, CD4/CD8 count 3.   rtc 5-6 months or sooner if problems   Genesis Novosad A. Felecia Shelling, MD, PhD 8/67/6720, 9:47 PM Certified in Neurology, Clinical Neurophysiology, Sleep Medicine, Pain Medicine and Neuroimaging  [facility] [address], [place], [place] [address] [phone]

## 2015-05-25 LAB — T-HELPER CELLS CD4/CD8 %
% CD 4 Pos. Lymph.: 55.7 % (ref 30.8–58.5)
ABSOLUTE CD 4 HELPER: 724 /uL (ref 359–1519)
BASOS ABS: 0 10*3/uL (ref 0.0–0.2)
BASOS: 1 %
CD3+CD4+ CELLS/CD3+CD8+ CELLS BLD: 3.76 — AB (ref 0.92–3.72)
CD3+CD8+ CELLS # BLD: 192 /uL (ref 109–897)
CD3+CD8+ CELLS NFR BLD: 14.8 % (ref 12.0–35.5)
EOS (ABSOLUTE): 0.1 10*3/uL (ref 0.0–0.4)
Eos: 1 %
Hematocrit: 36.9 % (ref 34.0–46.6)
Hemoglobin: 12.7 g/dL (ref 11.1–15.9)
Immature Grans (Abs): 0 10*3/uL (ref 0.0–0.1)
Immature Granulocytes: 0 %
LYMPHS: 20 %
Lymphocytes Absolute: 1.3 10*3/uL (ref 0.7–3.1)
MCH: 33.2 pg — ABNORMAL HIGH (ref 26.6–33.0)
MCHC: 34.4 g/dL (ref 31.5–35.7)
MCV: 97 fL (ref 79–97)
MONOCYTES: 4 %
Monocytes Absolute: 0.3 10*3/uL (ref 0.1–0.9)
NEUTROS PCT: 74 %
Neutrophils Absolute: 4.6 10*3/uL (ref 1.4–7.0)
Platelets: 270 10*3/uL (ref 150–379)
RBC: 3.82 x10E6/uL (ref 3.77–5.28)
RDW: 14.3 % (ref 12.3–15.4)
WBC: 6.3 10*3/uL (ref 3.4–10.8)

## 2015-07-15 ENCOUNTER — Ambulatory Visit: Payer: BLUE CROSS/BLUE SHIELD | Admitting: Neurology

## 2015-08-12 ENCOUNTER — Other Ambulatory Visit: Payer: Self-pay | Admitting: Neurology

## 2015-09-01 ENCOUNTER — Telehealth: Payer: Self-pay | Admitting: Neurology

## 2015-09-01 MED ORDER — FLUOXETINE HCL 20 MG PO TABS: 20.0000 mg | ORAL_TABLET | Freq: Every day | ORAL | Status: DC

## 2015-09-01 NOTE — Telephone Encounter (Signed)
Angie with [facility] [place] called requesting refill for FLUoxetine (PROZAC) 20 MG tablet . Ins will only for 90 day supply.

## 2015-09-20 ENCOUNTER — Encounter: Payer: Self-pay | Admitting: Neurology

## 2015-09-22 ENCOUNTER — Ambulatory Visit: Payer: BLUE CROSS/BLUE SHIELD | Admitting: Neurology

## 2015-10-01 ENCOUNTER — Encounter: Payer: Self-pay | Admitting: Neurology

## 2015-10-03 ENCOUNTER — Other Ambulatory Visit: Payer: Self-pay | Admitting: *Deleted

## 2015-10-03 DIAGNOSIS — Z79899 Other long term (current) drug therapy: Secondary | ICD-10-CM

## 2015-10-03 DIAGNOSIS — G35 Multiple sclerosis: Secondary | ICD-10-CM

## 2015-10-03 MED ORDER — AMPHETAMINE-DEXTROAMPHETAMINE 10 MG PO TABS: 10.0000 mg | ORAL_TABLET | Freq: Two times a day (BID) | ORAL | Status: DC

## 2015-10-03 NOTE — Telephone Encounter (Signed)
Pt. coming in for labwork tomorrow and requested to pick up Adderall rx. at the same time.  Rx. printed, signed, up front [facility]/fim

## 2015-10-04 ENCOUNTER — Ambulatory Visit: Payer: Self-pay | Admitting: Neurology

## 2015-10-13 ENCOUNTER — Other Ambulatory Visit (INDEPENDENT_AMBULATORY_CARE_PROVIDER_SITE_OTHER): Payer: Self-pay

## 2015-10-13 DIAGNOSIS — Z0289 Encounter for other administrative examinations: Secondary | ICD-10-CM

## 2015-10-13 DIAGNOSIS — Z79899 Other long term (current) drug therapy: Secondary | ICD-10-CM

## 2015-10-13 DIAGNOSIS — G35 Multiple sclerosis: Secondary | ICD-10-CM

## 2015-10-14 LAB — CBC WITH DIFFERENTIAL/PLATELET
BASOS ABS: 0 10*3/uL (ref 0.0–0.2)
Basos: 1 %
EOS (ABSOLUTE): 0.1 10*3/uL (ref 0.0–0.4)
EOS: 2 %
HEMATOCRIT: 37.4 % (ref 34.0–46.6)
Hemoglobin: 13 g/dL (ref 11.1–15.9)
IMMATURE GRANULOCYTES: 0 %
Immature Grans (Abs): 0 10*3/uL (ref 0.0–0.1)
Lymphocytes Absolute: 1.6 10*3/uL (ref 0.7–3.1)
Lymphs: 24 %
MCH: 33.2 pg — ABNORMAL HIGH (ref 26.6–33.0)
MCHC: 34.8 g/dL (ref 31.5–35.7)
MCV: 96 fL (ref 79–97)
MONOCYTES: 7 %
MONOS ABS: 0.5 10*3/uL (ref 0.1–0.9)
NEUTROS PCT: 66 %
Neutrophils Absolute: 4.5 10*3/uL (ref 1.4–7.0)
PLATELETS: 289 10*3/uL (ref 150–379)
RBC: 3.91 x10E6/uL (ref 3.77–5.28)
RDW: 13.3 % (ref 12.3–15.4)
WBC: 6.8 10*3/uL (ref 3.4–10.8)

## 2015-10-17 ENCOUNTER — Telehealth: Payer: Self-pay | Admitting: *Deleted

## 2015-10-17 NOTE — Telephone Encounter (Signed)
I have spoken with Laurie Cole this afternoon and per RAS, advised that Lymphocyte count is good--1.6, and that he would not be concerned unless it fell to 0.7 or lower.  She verbalized understanding of same/fim

## 2015-10-17 NOTE — Telephone Encounter (Signed)
LMTC./fim 

## 2015-10-17 NOTE — Telephone Encounter (Signed)
-----   Message from Britt Bottom, MD sent at 10/14/2015  2:36 PM EST ----- Please note that the lymphocyte count looks good (1.6)     I would only be concerned if she was 0.7 or lower.

## 2015-12-06 ENCOUNTER — Encounter: Payer: Self-pay | Admitting: Neurology

## 2015-12-09 ENCOUNTER — Encounter: Payer: Self-pay | Admitting: *Deleted

## 2015-12-12 ENCOUNTER — Encounter: Payer: Self-pay | Admitting: Neurology

## 2015-12-13 ENCOUNTER — Other Ambulatory Visit: Payer: Self-pay | Admitting: *Deleted

## 2015-12-13 ENCOUNTER — Encounter: Payer: Self-pay | Admitting: *Deleted

## 2015-12-13 MED ORDER — DIMETHYL FUMARATE 240 MG PO CPDR: DELAYED_RELEASE_CAPSULE | ORAL | Status: DC

## 2015-12-13 NOTE — Telephone Encounter (Signed)
Pt.  had a change in insurance.  She must now use [employer], so new rx. sent to them/fim

## 2015-12-17 ENCOUNTER — Encounter: Payer: Self-pay | Admitting: Neurology

## 2015-12-19 ENCOUNTER — Other Ambulatory Visit: Payer: Self-pay | Admitting: *Deleted

## 2015-12-19 DIAGNOSIS — G35 Multiple sclerosis: Secondary | ICD-10-CM

## 2015-12-26 ENCOUNTER — Ambulatory Visit
Admission: RE | Admit: 2015-12-26 | Discharge: 2015-12-26 | Disposition: A | Discharge status: Home / Self Care | Payer: 59 | Source: Ambulatory Visit | Attending: Neurology | Admitting: Neurology

## 2015-12-26 DIAGNOSIS — G35 Multiple sclerosis: Secondary | ICD-10-CM

## 2015-12-26 MED ORDER — GADOBENATE DIMEGLUMINE 529 MG/ML IV SOLN
13.0000 mL | Freq: Once | INTRAVENOUS | Status: AC | PRN
  Administered 2015-12-26: 13 mL via INTRAVENOUS

## 2015-12-27 ENCOUNTER — Ambulatory Visit (INDEPENDENT_AMBULATORY_CARE_PROVIDER_SITE_OTHER): Payer: 59 | Admitting: Neurology

## 2015-12-27 ENCOUNTER — Encounter: Payer: Self-pay | Admitting: Neurology

## 2015-12-27 VITALS — BP 116/74 | HR 68 | Resp 14 | Ht 63.0 in | Wt 144.0 lb

## 2015-12-27 DIAGNOSIS — R5383 Other fatigue: Secondary | ICD-10-CM

## 2015-12-27 DIAGNOSIS — F329 Major depressive disorder, single episode, unspecified: Secondary | ICD-10-CM

## 2015-12-27 DIAGNOSIS — Z79899 Other long term (current) drug therapy: Secondary | ICD-10-CM

## 2015-12-27 DIAGNOSIS — G35 Multiple sclerosis: Secondary | ICD-10-CM | POA: Diagnosis not present

## 2015-12-27 DIAGNOSIS — F32A Depression, unspecified: Secondary | ICD-10-CM

## 2015-12-27 MED ORDER — AMPHETAMINE-DEXTROAMPHETAMINE 10 MG PO TABS: 10.0000 mg | ORAL_TABLET | Freq: Two times a day (BID) | ORAL | Status: DC

## 2015-12-27 NOTE — Progress Notes (Signed)
[facility]  PATIENT: Laurie Cole DOB: August 14, 1976  REFERRING CLINICIAN: Loyal Gambler HISTORY FROM: Patient REASON FOR VISIT: MS   HISTORICAL  CHIEF COMPLAINT:  Chief Complaint  Patient presents with  . Multiple Sclerosis    Sts. she continues to tolerate Tecfidera well.  She had her yearly mri brain yesterday and is here to discuss the results.  Sts. she has some random muscle spasms/twitches--legs, arms, back./fim    HISTORY OF PRESENT ILLNESS:  Laurie Cole is a 40 year old woman with MS diagnosed in 2006.    She feels her MS has been stable and she denies any lacerations since her last visit. She has been on Tecfidera since 2013. Initially, she had some difficulty tolerating it but has tolerated it well over the past couple of years. Lymphocytes have been checked every 6 months and have been in the normal range.   She has noted some muscle twitching over the past few months.  She had an MRI of the brain with and without contrast yesterday I reviewed a side-by-side her MRI from 2016. It shows T2/FLAIR hyperintense foci in a pattern and configuration consistent with chronic demyelinating plaques of MS. There is no interval change and there are no acute findings on the current MRI.   Muscle fasciculations:  For many years, she noted twitching in her right buttock.   However, more recently, she has also noted twitching in other places such as the left triceps and both calves. In general, the fasciculations occur more frequently if she is at rest, especially after she exercises.  She denies any significant issues with gait, numbness, weakness or vision. He did have numbness in the past at the time of diagnosis.  She has no bladder urgency or frequency.  Fatigue/sleep:  Her fatigue is better since changing jobs and exercising more.     She has some heat intolerance with more fatigue. She takes Adderall 10 mg by mouth twice a day with benefit. She denies any side  effects.  She sleeps well.  Mood:   She has noted less depression with her new job and reduced her Prozac to 10 mg.   She denies any significant cognitive issues.    She had a mild vitamin D deficiency in the past and continues to take supplements.  MS History:  who presented with a Lhermite sign, visual changes and numbness in 2006. MRIs were performed that were consistent with multiple sclerosis. Additionally, visual evoked potentials showed slowing of the P100. She was placed on Betaseron. She stopped when she was pregnant about 2 years later. She returned to Betaseron for about another year and one half after her son was born. Then she had some breakthrough relapses and she was switched to Tysabri and tolerated it very well. Unfortunately, after a couple years she converted from negative to positive at a high titer.   She does not have any exacerbations while on Tysabri. She then switched to Tecfidera. She's been on Tecfidera about 3 years now and has tolerated it well. She has not had any exacerbations while on it. MRIs have been stable.    REVIEW OF SYSTEMS:  Constitutional: No fevers, chills, sweats, or change in appetite.  Notes  Fatigue Eyes: No visual changes, double vision, eye pain Ear, nose and throat: No hearing loss, ear pain, nasal congestion, sore throat Cardiovascular: No chest pain, palpitations Respiratory:  No shortness of breath at rest or with exertion.   No wheezes GastrointestinaI: No nausea, vomiting, diarrhea,  abdominal pain, fecal incontinence Genitourinary:  No dysuria, urinary retention or frequency.  No nocturia. Musculoskeletal:  No neck pain, back pain.   Reports a muscle spasm sensation in the right hip. Integumentary: No rash, pruritus, skin lesions Neurological: as above Psychiatric: Notes  depression at this time.  No anxiety Endocrine: No palpitations, diaphoresis, change in appetite, change in weigh or increased thirst Hematologic/Lymphatic:  No anemia,  purpura, petechiae. Allergic/Immunologic: No itchy/runny eyes, nasal congestion, recent allergic reactions, rashes  ALLERGIES: No Known Allergies  HOME MEDICATIONS:  Current outpatient prescriptions:  .  amphetamine-dextroamphetamine (ADDERALL) 10 MG tablet, Take 1 tablet (10 mg total) by mouth 2 (two) times daily., Disp: 180 tablet, Rfl: 0 .  cetirizine (ZYRTEC) 10 MG tablet, Take 10 mg by mouth daily.  , Disp: , Rfl:  .  Dimethyl Fumarate (TECFIDERA) 240 MG CPDR, TAKE ONE CAPSULE (240 MG) BY MOUTH TWO TIMES DAILY. STORE AT ROOM TEMPIN ORIGINAL CONTAINER. DO NOT CRUSH OR CHEW. SWALLOW WHOLE. DISCARD 9, Disp: 180 capsule, Rfl: 3 .  FLUoxetine (PROZAC) 20 MG tablet, Take 1 tablet (20 mg total) by mouth daily., Disp: 90 tablet, Rfl: 2 .  fluticasone (FLONASE) 50 MCG/ACT nasal spray, USE TWO SPRAYS IN EACH NOSTRIL DAILY, Disp: 16 g, Rfl: 2   PAST MEDICAL HISTORY: Past Medical History  Diagnosis Date  . Depression   . MS (multiple sclerosis) ([place])     sees Dr Kerman Passey in [facility]    PAST SURGICAL HISTORY: History reviewed. No pertinent past surgical history.  FAMILY HISTORY: Family History  Problem Relation Age of Onset  . Drug abuse Mother   . Arthritis Father   . Arthritis/Rheumatoid Father   . Seizures Maternal Aunt   . Parkinsonism Maternal Grandfather   . Lupus Maternal Aunt     SOCIAL HISTORY:  Social History   Social History  . Marital Status: Married    Spouse Name: Jeneen Rinks  . Number of Children: 1  . Years of Education: 54   Occupational History  . Education officer, museum    Social History Main Topics  . Smoking status: Never Smoker   . Smokeless tobacco: Not on file  . Alcohol Use: 0.5 oz/week    1 drink(s) per week  . Drug Use: No  . Sexual Activity:    Partners: Male    Birth Control/ Protection: Surgical   Other Topics Concern  . Not on file   Social History Narrative     PHYSICAL EXAM  Filed Vitals:   12/27/15 1548  BP: 116/74  Pulse: 68  Resp: 14    Height: 5\' 3"  (1.6 m)  Weight: 144 lb (65.318 kg)    Body mass index is 25.51 kg/(m^2).   General: The patient is well-developed and well-nourished and in no acute distress  Neurologic Exam  Mental status: The patient is alert and oriented x 3 at the time of the examination. The patient has apparent normal recent and remote memory, with an apparently normal attention span and concentration ability.   Speech is normal.  Cranial nerves: Extraocular movements are full.   Facial symmetry is present. There is good facial sensation to soft touch bilaterally.Facial strength is normal.    No dysarthria is noted.    No obvious hearing deficits are noted.  Motor:  No tremors or fasciculations were noted. Muscle bulk and tone are normal. Strength is  5 / 5 in all 4 extremities.   Sensory: Sensory testing is intact to touch in all 4 extremities.  Coordination: Cerebellar testing reveals good finger-nose-finger bilaterally.  Gait and station: Station and gait are normal. Tandem gait is normal. Romberg is negative.   Reflexes: Deep tendon reflexes are symmetric and increased in legs bilaterally, slightly more on her left.  There was spread at the knees.     DIAGNOSTIC DATA (LABS, IMAGING, TESTING) - I reviewed patient records, labs, notes, testing and imaging myself where available.     ASSESSMENT AND PLAN  MS (multiple sclerosis) ([place])  Depression  Other fatigue  High risk medication use  1.   Continue Adderall 10 mg po bid and Prozac 10 mg daily 2.  Continue Tecfidera, check CBC, CD4/CD8 count 3.   The occasional twitching appears to be benign fasciculations. However, if this worsens we will check a NCV/EMG. 4.  rtc 6 months or sooner if problems   Richard A. Felecia Shelling, MD, PhD [id number], 0000000 PM Certified in Neurology, Clinical Neurophysiology, Sleep Medicine, Pain Medicine and Neuroimaging  [facility] [address] [place], [place] [address] [phone]

## 2015-12-28 ENCOUNTER — Encounter: Payer: Self-pay | Admitting: Neurology

## 2015-12-28 ENCOUNTER — Other Ambulatory Visit: Payer: Self-pay | Admitting: *Deleted

## 2015-12-28 ENCOUNTER — Encounter: Payer: Self-pay | Admitting: *Deleted

## 2015-12-28 DIAGNOSIS — R253 Fasciculation: Secondary | ICD-10-CM | POA: Insufficient documentation

## 2015-12-28 LAB — HEPATIC FUNCTION PANEL
ALT: 15 IU/L (ref 0–32)
AST: 21 IU/L (ref 0–40)
Albumin: 4.8 g/dL (ref 3.5–5.5)
Alkaline Phosphatase: 47 IU/L (ref 39–117)
BILIRUBIN TOTAL: 1 mg/dL (ref 0.0–1.2)
Bilirubin, Direct: 0.23 mg/dL (ref 0.00–0.40)
Total Protein: 7.3 g/dL (ref 6.0–8.5)

## 2015-12-28 LAB — T-HELPER CELLS CD4/CD8 %
% CD 4 POS. LYMPH.: 53.3 % (ref 30.8–58.5)
Absolute CD 4 Helper: 746 /uL (ref 359–1519)
BASOS: 1 %
Basophils Absolute: 0 10*3/uL (ref 0.0–0.2)
CD3+CD4+ CELLS/CD3+CD8+ CELLS BLD: 3.6 (ref 0.92–3.72)
CD3+CD8+ CELLS NFR BLD: 14.8 % (ref 12.0–35.5)
CD3+CD8+ Cells # Bld: 207 /uL (ref 109–897)
EOS (ABSOLUTE): 0.1 10*3/uL (ref 0.0–0.4)
EOS: 1 %
HEMATOCRIT: 38.5 % (ref 34.0–46.6)
HEMOGLOBIN: 13.4 g/dL (ref 11.1–15.9)
Immature Grans (Abs): 0 10*3/uL (ref 0.0–0.1)
Immature Granulocytes: 0 %
LYMPHS ABS: 1.4 10*3/uL (ref 0.7–3.1)
Lymphs: 23 %
MCH: 32.8 pg (ref 26.6–33.0)
MCHC: 34.8 g/dL (ref 31.5–35.7)
MCV: 94 fL (ref 79–97)
MONOS ABS: 0.4 10*3/uL (ref 0.1–0.9)
Monocytes: 6 %
NEUTROS ABS: 4.4 10*3/uL (ref 1.4–7.0)
NEUTROS PCT: 69 %
Platelets: 240 10*3/uL (ref 150–379)
RBC: 4.08 x10E6/uL (ref 3.77–5.28)
RDW: 13.5 % (ref 12.3–15.4)
WBC: 6.3 10*3/uL (ref 3.4–10.8)

## 2015-12-30 ENCOUNTER — Telehealth: Payer: Self-pay

## 2015-12-30 NOTE — Telephone Encounter (Signed)
Laurie Cole has been contacted regarding coverage for Adderall.  Request is under review Ref # [id number] They ask for a minimum of 5 business days to respond due to high request volume.

## 2015-12-30 NOTE — Telephone Encounter (Signed)
-----   Message from Britt Bottom, MD sent at 12/28/2015  5:15 PM EST ----- Please let Laurie Cole note that the lymphocyte count, CD4 count and CD8 count are all normal.    LFTs also normal.

## 2016-01-05 ENCOUNTER — Ambulatory Visit (INDEPENDENT_AMBULATORY_CARE_PROVIDER_SITE_OTHER): Payer: 59 | Admitting: Neurology

## 2016-01-05 ENCOUNTER — Ambulatory Visit (INDEPENDENT_AMBULATORY_CARE_PROVIDER_SITE_OTHER): Payer: Self-pay | Admitting: Neurology

## 2016-01-05 DIAGNOSIS — R253 Fasciculation: Secondary | ICD-10-CM

## 2016-01-05 DIAGNOSIS — R5383 Other fatigue: Secondary | ICD-10-CM

## 2016-01-05 DIAGNOSIS — Z0289 Encounter for other administrative examinations: Secondary | ICD-10-CM

## 2016-01-05 NOTE — Progress Notes (Signed)
   STUDY DATE: 01/05/2016 PATIENT NAME: ADELISE MCDUFFEE DOB: June 13, 1976 MRN: [id number]  HISTORY:  Lacree Fierro is a 40 year old woman who has had fasciculations in the left arm and both legs for the past 6 months. Fasciculations in the arm are mostly in the deltoid. Fasciculations in the legs can be in various muscles of the hip and proximal leg. There has not been change in strength.  NERVE CONDUCTION STUDIES:  The left median, ulnar, peroneal and posterior tibial motor responses had normal distal latencies, amplitudes and conduction velocities. F wave responses were normal.  The left median, ulnar and superficial peroneal sensory responses had normal latencies and amplitudes.  EMG STUDIES:  Needle EMG of selected muscles of the left arm and leg were performed. Motor unit morphology and recruitment was normal in all of the muscles tested. There was no abnormal spontaneous activity.  Muscles tested included the deltoid, triceps, biceps, EDC and first DIO in the left arm. Additionally, the vastus medialis, anterior, peroneus longus, gluteus medius and first DIO of the foot were tested in the left leg.  IMPRESSION:  This was a normal NCV/EMG of the left arm and leg. There is no evidence of neuropathy, radiculopathy, motor neuron disease or myopathy.   Noeh Sparacino A. Felecia Shelling, MD, PhD Certified in Neurology, [place] [facility], Sleep Medicine, Pain Medicine and Neuroimaging  [facility] [address], [place], [place] [address] [phone]

## 2016-01-09 NOTE — Telephone Encounter (Signed)
I called ins to follow up on request today (Monday).  Spoke with Barnett Applebaum.  She stated they cancelled the request because this drug is covered under the current benefit plan.  She ran a test claim and verified it paid without any issues.  I called the patient to advise.  Got no answer.  Left message.

## 2016-02-09 ENCOUNTER — Telehealth: Payer: Self-pay | Admitting: Neurology

## 2016-02-09 ENCOUNTER — Encounter: Payer: Self-pay | Admitting: Neurology

## 2016-02-09 MED ORDER — OSELTAMIVIR PHOSPHATE 75 MG PO CAPS: 75.0000 mg | ORAL_CAPSULE | Freq: Two times a day (BID) | ORAL | Status: DC

## 2016-02-09 NOTE — Telephone Encounter (Signed)
T[facility]iflu escribed [facility] [place] per RAS ok/fim

## 2016-02-09 NOTE — Telephone Encounter (Signed)
Patient called to advise that she is out of town in [place] for a conference and husband and son tested positive for flu yesterday, patient is having symptoms today, headache, sore throat and wants to know if Dr. Felecia Shelling will [facility] Rx [facility] [place], [place] Phone# [phone], patient does not have PCP at this time.

## 2016-02-09 NOTE — Telephone Encounter (Signed)
Laurie Cole is aware rx. has been sent in/fim

## 2016-02-13 ENCOUNTER — Other Ambulatory Visit: Payer: Self-pay | Admitting: *Deleted

## 2016-02-13 ENCOUNTER — Encounter: Payer: Self-pay | Admitting: Neurology

## 2016-02-13 MED ORDER — AMPHETAMINE-DEXTROAMPHETAMINE 10 MG PO TABS: 10.0000 mg | ORAL_TABLET | Freq: Two times a day (BID) | ORAL | Status: DC

## 2016-02-13 NOTE — Telephone Encounter (Signed)
Adderall rx. printed, signed, up front [facility] per emailed request/fim

## 2016-03-13 ENCOUNTER — Encounter: Payer: Self-pay | Admitting: Neurology

## 2016-03-14 ENCOUNTER — Other Ambulatory Visit: Payer: Self-pay | Admitting: *Deleted

## 2016-03-14 MED ORDER — AMPHETAMINE-DEXTROAMPHETAMINE 10 MG PO TABS: 10.0000 mg | ORAL_TABLET | Freq: Three times a day (TID) | ORAL | Status: DC

## 2016-03-14 MED ORDER — AMPHETAMINE-DEXTROAMPHETAMINE 10 MG PO TABS: 10.0000 mg | ORAL_TABLET | Freq: Two times a day (BID) | ORAL | Status: DC

## 2016-03-14 NOTE — Telephone Encounter (Signed)
Adderall rx. up front [facility]/fim 

## 2016-03-14 NOTE — Telephone Encounter (Signed)
1st rx. printed today still had instructions to take bid.  Rx. reprinted with correct instructions./fim

## 2016-03-14 NOTE — Telephone Encounter (Signed)
Rx. awaiting RAS sig/fim 

## 2016-04-24 ENCOUNTER — Encounter: Payer: Self-pay | Admitting: Neurology

## 2016-04-24 ENCOUNTER — Telehealth: Payer: Self-pay | Admitting: *Deleted

## 2016-04-24 MED ORDER — AMPHETAMINE-DEXTROAMPHETAMINE 10 MG PO TABS: 10.0000 mg | ORAL_TABLET | Freq: Three times a day (TID) | ORAL | Status: DC

## 2016-04-24 NOTE — Telephone Encounter (Signed)
Adderall rx. up front [facility], per emailed request/fim 

## 2016-05-24 ENCOUNTER — Telehealth: Payer: Self-pay | Admitting: *Deleted

## 2016-05-24 ENCOUNTER — Encounter: Payer: Self-pay | Admitting: Neurology

## 2016-05-24 MED ORDER — AMPHETAMINE-DEXTROAMPHETAMINE 10 MG PO TABS: 10.0000 mg | ORAL_TABLET | Freq: Three times a day (TID) | ORAL | Status: DC

## 2016-05-24 NOTE — Telephone Encounter (Signed)
Adderall rx. up front [facility]/fim 

## 2016-06-18 ENCOUNTER — Encounter: Payer: Self-pay | Admitting: Neurology

## 2016-06-22 ENCOUNTER — Other Ambulatory Visit: Payer: Self-pay | Admitting: *Deleted

## 2016-06-22 DIAGNOSIS — G35 Multiple sclerosis: Secondary | ICD-10-CM

## 2016-06-22 DIAGNOSIS — Z79899 Other long term (current) drug therapy: Secondary | ICD-10-CM

## 2016-06-26 ENCOUNTER — Encounter: Payer: Self-pay | Admitting: Neurology

## 2016-06-26 ENCOUNTER — Ambulatory Visit (INDEPENDENT_AMBULATORY_CARE_PROVIDER_SITE_OTHER): Payer: 59 | Admitting: Neurology

## 2016-06-26 VITALS — BP 110/78 | HR 68 | Resp 14 | Ht 63.0 in | Wt 142.5 lb

## 2016-06-26 DIAGNOSIS — R5383 Other fatigue: Secondary | ICD-10-CM | POA: Diagnosis not present

## 2016-06-26 DIAGNOSIS — Z79899 Other long term (current) drug therapy: Secondary | ICD-10-CM | POA: Diagnosis not present

## 2016-06-26 DIAGNOSIS — R253 Fasciculation: Secondary | ICD-10-CM | POA: Diagnosis not present

## 2016-06-26 DIAGNOSIS — G35 Multiple sclerosis: Secondary | ICD-10-CM

## 2016-06-26 MED ORDER — AMPHETAMINE-DEXTROAMPHETAMINE 10 MG PO TABS: 10.0000 mg | ORAL_TABLET | Freq: Three times a day (TID) | ORAL | 0 refills | Status: DC

## 2016-06-26 NOTE — Progress Notes (Signed)
[facility]  PATIENT: Laurie Cole DOB: 1976-09-27  REFERRING CLINICIAN: Loyal Gambler HISTORY FROM: Patient REASON FOR VISIT: MS   HISTORICAL  CHIEF COMPLAINT:  Chief Complaint  Patient presents with  . Multiple Sclerosis    HISTORY OF PRESENT ILLNESS:  Laurie Cole is a 40 year old woman with MS diagnosed in 2006.    She feels her MS has been stable.   She has no exacerbations since her last visit. She has been on Tecfidera since 2013. Initially, she had some difficulty tolerating it but has tolerated it well over the past couple of years. Lymphocytes have been checked every 6 months and have been in the normal range.   She has had more flushing despite taking it after a meal. Aspirin has not helped as much as it did in the past.   Her MRI of the brain 12/26/2015 with and without contrast shows T2/FLAIR hyperintense foci in a pattern and configuration consistent with chronic demyelinating plaques of MS. There is no interval change (compared to 01/2015) and there are no acute findings on the current MRI.   Muscle fasciculations:  Her fasciculations are doing better and are back to being more random.    Her EMG/NCV was normal.      She denies any significant issues with gait, numbness, weakness or vision. She did have numbness in the past at the time of diagnosis.  She has no bladder urgency or frequency.  Fatigue/sleep:  Her fatigue is generally better since changing jobs and exercising more.     She has some heat intolerance with more fatigue. As needed, she takes Adderall 10 mg by mouth twice a day with benefit. She denies any side effects.  She sleeps well.  Mood:   She has noted less depression with her new job and on Prozac to 10 mg.   She denies any significant cognitive issues.    She had a mild vitamin D deficiency in the past and continues to take supplements.  MS History:  who presented with a Lhermite sign, visual changes and numbness in 2006. MRIs  were performed that were consistent with multiple sclerosis. Additionally, visual evoked potentials showed slowing of the P100. She was placed on Betaseron. She stopped when she was pregnant about 2 years later. She returned to Betaseron for about another year and one half after her son was born. Then she had some breakthrough relapses and she was switched to Tysabri and tolerated it very well. Unfortunately, after a couple years she converted from negative to positive at a high titer.   She does not have any exacerbations while on Tysabri. She then switched to Tecfidera. She's been on Tecfidera about 3 years now and has tolerated it well. She has not had any exacerbations while on it. MRIs have been stable.    REVIEW OF SYSTEMS:  Constitutional: No fevers, chills, sweats, or change in appetite.  Notes  Fatigue Eyes: No visual changes, double vision, eye pain Ear, nose and throat: No hearing loss, ear pain, nasal congestion, sore throat Cardiovascular: No chest pain, palpitations Respiratory:  No shortness of breath at rest or with exertion.   No wheezes GastrointestinaI: No nausea, vomiting, diarrhea, abdominal pain, fecal incontinence Genitourinary:  No dysuria, urinary retention or frequency.  No nocturia. Musculoskeletal:  No neck pain, back pain.   Reports a muscle spasm sensation in the right hip. Integumentary: No rash, pruritus, skin lesions Neurological: as above Psychiatric: Notes  depression at this time.  No  anxiety Endocrine: No palpitations, diaphoresis, change in appetite, change in weigh or increased thirst Hematologic/Lymphatic:  No anemia, purpura, petechiae. Allergic/Immunologic: No itchy/runny eyes, nasal congestion, recent allergic reactions, rashes  ALLERGIES: No Known Allergies  HOME MEDICATIONS:  Current Outpatient Prescriptions:  .  amphetamine-dextroamphetamine (ADDERALL) 10 MG tablet, Take 1 tablet (10 mg total) by mouth 3 (three) times daily., Disp: 90 tablet,  Rfl: 0 .  Biotin 10000 MCG TABS, Take by mouth., Disp: , Rfl:  .  cetirizine (ZYRTEC) 10 MG tablet, Take 10 mg by mouth daily.  , Disp: , Rfl:  .  cholecalciferol (VITAMIN D) 1000 units tablet, Take 5,000 Units by mouth daily., Disp: , Rfl:  .  Dimethyl Fumarate (TECFIDERA) 240 MG CPDR, TAKE ONE CAPSULE (240 MG) BY MOUTH TWO TIMES DAILY. STORE AT ROOM TEMPIN ORIGINAL CONTAINER. DO NOT CRUSH OR CHEW. SWALLOW WHOLE. DISCARD 9, Disp: 180 capsule, Rfl: 3 .  FLUoxetine (PROZAC) 20 MG tablet, Take 1 tablet (20 mg total) by mouth daily., Disp: 90 tablet, Rfl: 2 .  fluticasone (FLONASE) 50 MCG/ACT nasal spray, USE TWO SPRAYS IN EACH NOSTRIL DAILY (Patient not taking: Reported on 06/26/2016), Disp: 16 g, Rfl: 2 .  oseltamivir (TAMIFLU) 75 MG capsule, Take 1 capsule (75 mg total) by mouth 2 (two) times daily. (Patient not taking: Reported on 06/26/2016), Disp: 10 capsule, Rfl: 0   PAST MEDICAL HISTORY: Past Medical History:  Diagnosis Date  . Depression   . MS (multiple sclerosis) ([place])    sees Dr Kerman Passey in [facility]    PAST SURGICAL HISTORY: History reviewed. No pertinent surgical history.  FAMILY HISTORY: Family History  Problem Relation Age of Onset  . Drug abuse Mother   . Arthritis Father   . Arthritis/Rheumatoid Father   . Seizures Maternal Aunt   . Parkinsonism Maternal Grandfather   . Lupus Maternal Aunt     SOCIAL HISTORY:  Social History   Social History  . Marital status: Married    Spouse name: Jeneen Rinks  . Number of children: 1  . Years of education: 75   Occupational History  . Education officer, museum    Social History Main Topics  . Smoking status: Never Smoker  . Smokeless tobacco: Not on file  . Alcohol use 0.5 oz/week    1 drink(s) per week  . Drug use: No  . Sexual activity: Yes    Partners: Male    Birth control/ protection: Surgical   Other Topics Concern  . Not on file   Social History Narrative  . No narrative on file     PHYSICAL EXAM  Vitals:   06/26/16 1415    BP: 110/78  Pulse: 68  Resp: 14  Weight: 142 lb 8 oz (64.6 kg)  Height: 5\' 3"  (1.6 m)    Body mass index is 25.24 kg/m.   General: The patient is well-developed and well-nourished and in no acute distress  Neurologic Exam  Mental status: The patient is alert and oriented x 3 at the time of the examination. The patient has apparent normal recent and remote memory, with an apparently normal attention span and concentration ability.   Speech is normal.  Cranial nerves: Extraocular movements are full.   Facial symmetry is present. There is good facial sensation to soft touch bilaterally.Facial strength is normal.    No dysarthria is noted.    No obvious hearing deficits are noted.  Motor:  No tremors or fasciculations were noted. Muscle bulk and tone are normal. Strength is  5 / 5 in all 4 extremities.   Sensory: Sensory testing is intact to touch in all 4 extremities.  Coordination: Cerebellar testing reveals good finger-nose-finger bilaterally.  Gait and station: Station and gait are normal. Tandem gait is normal. Romberg is negative.   Reflexes: Deep tendon reflexes are symmetric and increased in legs bilaterally, slightly more on her left.  There was spread at the knees.     DIAGNOSTIC DATA (LABS, IMAGING, TESTING) - I reviewed patient records, labs, notes, testing and imaging myself where available.     ASSESSMENT AND PLAN  MS (multiple sclerosis) ([place]) - Plan: CBC with Differential/Platelet, T-helper cells CD4/CD8 %, Comprehensive metabolic panel, Lipid Panel  Other fatigue  Benign fasciculations  High risk medication use - Plan: CBC with Differential/Platelet, T-helper cells CD4/CD8 %, Comprehensive metabolic panel, Lipid Panel    1.   Continue Adderall 10 mg po bid and Prozac 10 mg daily 2.  Continue Tecfidera, check CBC, CD4/CD8 count  around the time of the next visit we will check another MRI of the brain to assess for subclinical progression. If present, we  would need to consider a different disease modifying therapy. We did discuss some of the newer medications including ocrelizumab that she would consider if she had a breakthrough disease. 3.   rtc 6 months or sooner if problems   Richard A. Felecia Shelling, MD, PhD [id number], 0000000 PM Certified in Neurology, Clinical Neurophysiology, Sleep Medicine, Pain Medicine and Neuroimaging  [facility] [address] [place], [place] [address] [phone]

## 2016-07-02 ENCOUNTER — Other Ambulatory Visit (INDEPENDENT_AMBULATORY_CARE_PROVIDER_SITE_OTHER): Payer: Self-pay

## 2016-07-02 DIAGNOSIS — G35 Multiple sclerosis: Secondary | ICD-10-CM

## 2016-07-02 DIAGNOSIS — Z0289 Encounter for other administrative examinations: Secondary | ICD-10-CM

## 2016-07-02 DIAGNOSIS — Z79899 Other long term (current) drug therapy: Secondary | ICD-10-CM

## 2016-07-03 LAB — LIPID PANEL
CHOL/HDL RATIO: 2.7 ratio (ref 0.0–4.4)
CHOLESTEROL TOTAL: 173 mg/dL (ref 100–199)
HDL: 64 mg/dL (ref >39)
LDL Calculated: 96 mg/dL (ref 0–99)
TRIGLYCERIDES: 64 mg/dL (ref 0–149)
VLDL Cholesterol Cal: 13 mg/dL (ref 5–40)

## 2016-07-03 LAB — T-HELPER CELLS CD4/CD8 %
% CD 4 Pos. Lymph.: 54.4 % (ref 30.8–58.5)
Absolute CD 4 Helper: 598 /uL (ref 359–1519)
BASOS: 1 %
Basophils Absolute: 0 10*3/uL (ref 0.0–0.2)
CD3+CD4+ CELLS/CD3+CD8+ CELLS BLD: 4.32 — AB (ref 0.92–3.72)
CD3+CD8+ CELLS NFR BLD: 12.6 % (ref 12.0–35.5)
CD3+CD8+ Cells # Bld: 139 /uL (ref 109–897)
EOS (ABSOLUTE): 0.1 10*3/uL (ref 0.0–0.4)
EOS: 2 %
HEMOGLOBIN: 13.2 g/dL (ref 11.1–15.9)
Hematocrit: 39.1 % (ref 34.0–46.6)
Immature Grans (Abs): 0 10*3/uL (ref 0.0–0.1)
Immature Granulocytes: 0 %
LYMPHS ABS: 1.1 10*3/uL (ref 0.7–3.1)
Lymphs: 26 %
MCH: 33 pg (ref 26.6–33.0)
MCHC: 33.8 g/dL (ref 31.5–35.7)
MCV: 98 fL — AB (ref 79–97)
Monocytes Absolute: 0.3 10*3/uL (ref 0.1–0.9)
Monocytes: 8 %
NEUTROS ABS: 2.6 10*3/uL (ref 1.4–7.0)
Neutrophils: 63 %
Platelets: 240 10*3/uL (ref 150–379)
RBC: 4 x10E6/uL (ref 3.77–5.28)
RDW: 14.3 % (ref 12.3–15.4)
WBC: 4.1 10*3/uL (ref 3.4–10.8)

## 2016-07-03 LAB — COMPREHENSIVE METABOLIC PANEL
A/G RATIO: 1.8 (ref 1.2–2.2)
ALK PHOS: 44 IU/L (ref 39–117)
ALT: 13 IU/L (ref 0–32)
AST: 20 IU/L (ref 0–40)
Albumin: 4.6 g/dL (ref 3.5–5.5)
BUN/Creatinine Ratio: 17 (ref 9–23)
BUN: 12 mg/dL (ref 6–20)
Bilirubin Total: 1.1 mg/dL (ref 0.0–1.2)
CO2: 23 mmol/L (ref 18–29)
Calcium: 9 mg/dL (ref 8.7–10.2)
Chloride: 101 mmol/L (ref 96–106)
Creatinine, Ser: 0.69 mg/dL (ref 0.57–1.00)
GFR calc Af Amer: 129 mL/min/{1.73_m2} (ref >59)
GFR, EST NON AFRICAN AMERICAN: 112 mL/min/{1.73_m2} (ref >59)
GLOBULIN, TOTAL: 2.5 g/dL (ref 1.5–4.5)
Glucose: 79 mg/dL (ref 65–99)
POTASSIUM: 3.6 mmol/L (ref 3.5–5.2)
SODIUM: 140 mmol/L (ref 134–144)
Total Protein: 7.1 g/dL (ref 6.0–8.5)

## 2016-07-03 LAB — HEPATIC FUNCTION PANEL: BILIRUBIN, DIRECT: 0.22 mg/dL (ref 0.00–0.40)

## 2016-07-04 NOTE — Telephone Encounter (Signed)
I spoke to Laurie Cole to let her know that the lab work looked good.   CD4 and CD8 both within normal range

## 2016-08-01 ENCOUNTER — Other Ambulatory Visit: Payer: Self-pay | Admitting: *Deleted

## 2016-08-01 ENCOUNTER — Other Ambulatory Visit: Payer: Self-pay | Admitting: Neurology

## 2016-08-01 ENCOUNTER — Encounter: Payer: Self-pay | Admitting: Neurology

## 2016-08-01 MED ORDER — AMPHETAMINE-DEXTROAMPHETAMINE 10 MG PO TABS: 10.0000 mg | ORAL_TABLET | Freq: Three times a day (TID) | ORAL | 0 refills | Status: DC

## 2016-08-03 DIAGNOSIS — G35 Multiple sclerosis: Secondary | ICD-10-CM | POA: Insufficient documentation

## 2016-08-03 DIAGNOSIS — Z803 Family history of malignant neoplasm of breast: Secondary | ICD-10-CM | POA: Insufficient documentation

## 2016-08-03 DIAGNOSIS — G35A Relapsing-remitting multiple sclerosis: Secondary | ICD-10-CM | POA: Insufficient documentation

## 2016-08-03 DIAGNOSIS — J309 Allergic rhinitis, unspecified: Secondary | ICD-10-CM | POA: Insufficient documentation

## 2016-08-03 DIAGNOSIS — F3281 Premenstrual dysphoric disorder: Secondary | ICD-10-CM | POA: Insufficient documentation

## 2016-08-25 ENCOUNTER — Encounter: Payer: Self-pay | Admitting: Neurology

## 2016-08-27 ENCOUNTER — Telehealth: Payer: Self-pay | Admitting: *Deleted

## 2016-08-27 MED ORDER — AMPHETAMINE-DEXTROAMPHETAMINE 10 MG PO TABS: 10.0000 mg | ORAL_TABLET | Freq: Three times a day (TID) | ORAL | 0 refills | Status: DC

## 2016-08-27 NOTE — Telephone Encounter (Signed)
Rx. up front [facility]/fim 

## 2016-10-08 ENCOUNTER — Other Ambulatory Visit: Payer: Self-pay | Admitting: Neurology

## 2016-10-08 ENCOUNTER — Telehealth: Payer: Self-pay | Admitting: *Deleted

## 2016-10-08 ENCOUNTER — Encounter: Payer: Self-pay | Admitting: Neurology

## 2016-10-08 MED ORDER — AMPHETAMINE-DEXTROAMPHETAMINE 10 MG PO TABS: ORAL_TABLET | ORAL | 0 refills | Status: DC

## 2016-10-08 NOTE — Telephone Encounter (Signed)
Adderall rx. up front [facility]/fim 

## 2016-10-22 ENCOUNTER — Other Ambulatory Visit: Payer: Self-pay | Admitting: Neurology

## 2016-11-14 ENCOUNTER — Encounter: Payer: Self-pay | Admitting: Neurology

## 2016-11-14 ENCOUNTER — Telehealth: Payer: Self-pay | Admitting: *Deleted

## 2016-11-14 MED ORDER — AMPHETAMINE-DEXTROAMPHETAMINE 10 MG PO TABS: ORAL_TABLET | ORAL | 0 refills | Status: DC

## 2016-11-14 NOTE — Telephone Encounter (Signed)
Adderall rx. up front [facility]/fim 

## 2016-11-29 ENCOUNTER — Other Ambulatory Visit: Payer: Self-pay | Admitting: Neurology

## 2016-12-20 ENCOUNTER — Encounter: Payer: Self-pay | Admitting: Neurology

## 2016-12-21 ENCOUNTER — Telehealth: Payer: Self-pay | Admitting: *Deleted

## 2016-12-21 DIAGNOSIS — G35 Multiple sclerosis: Secondary | ICD-10-CM

## 2016-12-21 DIAGNOSIS — R7989 Other specified abnormal findings of blood chemistry: Secondary | ICD-10-CM

## 2016-12-21 DIAGNOSIS — Z79899 Other long term (current) drug therapy: Secondary | ICD-10-CM

## 2016-12-21 MED ORDER — AMPHETAMINE-DEXTROAMPHETAMINE 10 MG PO TABS: ORAL_TABLET | ORAL | 0 refills | Status: DC

## 2016-12-21 NOTE — Telephone Encounter (Signed)
Adderall rx. up front [facility].  MRI and labs ordered as requested/fim

## 2016-12-27 ENCOUNTER — Ambulatory Visit: Payer: 59 | Admitting: Neurology

## 2016-12-30 ENCOUNTER — Ambulatory Visit
Admission: RE | Admit: 2016-12-30 | Discharge: 2016-12-30 | Disposition: A | Discharge status: Home / Self Care | Payer: 59 | Source: Ambulatory Visit | Attending: Neurology | Admitting: Neurology

## 2016-12-30 DIAGNOSIS — M5440 Lumbago with sciatica, unspecified side: Secondary | ICD-10-CM | POA: Insufficient documentation

## 2016-12-30 DIAGNOSIS — G35 Multiple sclerosis: Secondary | ICD-10-CM | POA: Diagnosis not present

## 2016-12-30 MED ORDER — GADOBENATE DIMEGLUMINE 529 MG/ML IV SOLN
13.0000 mL | Freq: Once | INTRAVENOUS | Status: AC | PRN
  Administered 2016-12-30: 13 mL via INTRAVENOUS

## 2016-12-31 ENCOUNTER — Encounter: Payer: Self-pay | Admitting: *Deleted

## 2017-01-02 ENCOUNTER — Other Ambulatory Visit (INDEPENDENT_AMBULATORY_CARE_PROVIDER_SITE_OTHER): Payer: Self-pay

## 2017-01-02 DIAGNOSIS — Z0289 Encounter for other administrative examinations: Secondary | ICD-10-CM

## 2017-01-02 DIAGNOSIS — Z79899 Other long term (current) drug therapy: Secondary | ICD-10-CM

## 2017-01-02 DIAGNOSIS — G35 Multiple sclerosis: Secondary | ICD-10-CM

## 2017-01-03 ENCOUNTER — Encounter: Payer: Self-pay | Admitting: Neurology

## 2017-01-03 ENCOUNTER — Ambulatory Visit (INDEPENDENT_AMBULATORY_CARE_PROVIDER_SITE_OTHER): Payer: 59 | Admitting: Neurology

## 2017-01-03 VITALS — BP 117/83 | HR 90 | Resp 14 | Ht 62.0 in | Wt 142.0 lb

## 2017-01-03 DIAGNOSIS — R5383 Other fatigue: Secondary | ICD-10-CM

## 2017-01-03 DIAGNOSIS — G35 Multiple sclerosis: Secondary | ICD-10-CM

## 2017-01-03 DIAGNOSIS — R253 Fasciculation: Secondary | ICD-10-CM | POA: Diagnosis not present

## 2017-01-03 DIAGNOSIS — Z79899 Other long term (current) drug therapy: Secondary | ICD-10-CM | POA: Diagnosis not present

## 2017-01-03 LAB — CBC WITH DIFFERENTIAL/PLATELET
BASOS ABS: 0 10*3/uL (ref 0.0–0.2)
BASOS: 1 %
EOS (ABSOLUTE): 0.1 10*3/uL (ref 0.0–0.4)
Eos: 2 %
HEMATOCRIT: 37.5 % (ref 34.0–46.6)
HEMOGLOBIN: 13.1 g/dL (ref 11.1–15.9)
IMMATURE GRANS (ABS): 0 10*3/uL (ref 0.0–0.1)
Immature Granulocytes: 0 %
LYMPHS ABS: 1.3 10*3/uL (ref 0.7–3.1)
LYMPHS: 21 %
MCH: 33.2 pg — AB (ref 26.6–33.0)
MCHC: 34.9 g/dL (ref 31.5–35.7)
MCV: 95 fL (ref 79–97)
MONOCYTES: 7 %
Monocytes Absolute: 0.4 10*3/uL (ref 0.1–0.9)
NEUTROS ABS: 4.3 10*3/uL (ref 1.4–7.0)
Neutrophils: 69 %
Platelets: 290 10*3/uL (ref 150–379)
RBC: 3.95 x10E6/uL (ref 3.77–5.28)
RDW: 13.3 % (ref 12.3–15.4)
WBC: 6.2 10*3/uL (ref 3.4–10.8)

## 2017-01-03 LAB — HEPATIC FUNCTION PANEL
ALBUMIN: 4.5 g/dL (ref 3.5–5.5)
ALK PHOS: 48 IU/L (ref 39–117)
ALT: 10 IU/L (ref 0–32)
AST: 19 IU/L (ref 0–40)
BILIRUBIN TOTAL: 0.6 mg/dL (ref 0.0–1.2)
Bilirubin, Direct: 0.19 mg/dL (ref 0.00–0.40)
TOTAL PROTEIN: 6.9 g/dL (ref 6.0–8.5)

## 2017-01-03 NOTE — Progress Notes (Signed)
[facility]  PATIENT: Laurie Cole DOB: [birth year]  REFERRING CLINICIAN: Loyal Gambler HISTORY FROM: Patient REASON FOR VISIT: MS   HISTORICAL  CHIEF COMPLAINT:  Chief Complaint  Patient presents with  . Multiple Sclerosis    Sts. she continues to tolerate Tecfidera well.  Had labs drawn yesterday in our office/fim    HISTORY OF PRESENT ILLNESS:  Laurie Cole is a 41 year old woman with MS diagnosed in 2006.      MS:  For MS is stable. She has no new exacerbations. She has been on Tecfidera since 2013 and she tolerates it reasonably well but continues to have some flushing    Lymphocytes have been checked every 6 months and have been in the normal range.   Flushing is better with moving the timing of her pills and taking aspirin.      Her MRI of the brain 12/29/2016 with and without contrast shows T2/FLAIR hyperintense foci in a pattern and configuration consistent with chronic demyelinating plaques of MS. There is no interval change (compared to 12/2015) and there are no acute findings on the current MRI.         Muscle fasciculations:  She has occasional muscle fasciculations, worse when tired.  Her EMG/NCV was normal.      She denies any significant issues with gait, numbness, weakness or vision. She did have numbness in the past at the time of diagnosis.  She has no bladder urgency or frequency.  Fatigue/sleep:  Her fatigue is doing better since she is exercising more     She has some heat intolerance with more fatigue. As needed, she takes Adderall 10 mg by mouth twice a day with benefit. She denies any side effects.  She sleeps well most nights.  Mood:   She has been on Prozac 20 mg and tolerates it well.   She feels mood is doing very well.    She denies any significant cognitive issues.    Vit D:   She is on vit D supplements of a mild deficiency.  MS History:  who presented with a Lhermite sign, visual changes and numbness in 2006. MRIs were  performed that were consistent with multiple sclerosis. Additionally, visual evoked potentials showed slowing of the P100. She was placed on Betaseron. She stopped when she was pregnant about 2 years later. She returned to Betaseron for about another year and one half after her son was born. Then she had some breakthrough relapses and she was switched to Tysabri and tolerated it very well. Unfortunately, after a couple years she converted from negative to positive at a high titer.   She does not have any exacerbations while on Tysabri. She then switched to Tecfidera. She's been on Tecfidera about 3 years now and has tolerated it well. She has not had any exacerbations while on it. MRIs have been stable.    REVIEW OF SYSTEMS:  Constitutional: No fevers, chills, sweats, or change in appetite.  Notes  Fatigue Eyes: No visual changes, double vision, eye pain Ear, nose and throat: No hearing loss, ear pain, nasal congestion, sore throat Cardiovascular: No chest pain, palpitations Respiratory:  No shortness of breath at rest or with exertion.   No wheezes GastrointestinaI: No nausea, vomiting, diarrhea, abdominal pain, fecal incontinence Genitourinary:  No dysuria, urinary retention or frequency.  No nocturia. Musculoskeletal:  No neck pain, back pain.   Reports a muscle spasm sensation in the right hip. Integumentary: No rash, pruritus, skin lesions Neurological: as  above Psychiatric: Notes  depression at this time.  No anxiety Endocrine: No palpitations, diaphoresis, change in appetite, change in weigh or increased thirst Hematologic/Lymphatic:  No anemia, purpura, petechiae. Allergic/Immunologic: No itchy/runny eyes, nasal congestion, recent allergic reactions, rashes  ALLERGIES: No Known Allergies  HOME MEDICATIONS:  Current Outpatient Prescriptions:  .  amphetamine-dextroamphetamine (ADDERALL) 10 MG tablet, Take one tablet 3 times daily as needed., Disp: 90 tablet, Rfl: 0 .  Biotin 10000 MCG  TABS, Take by mouth., Disp: , Rfl:  .  cholecalciferol (VITAMIN D) 1000 units tablet, Take 5,000 Units by mouth daily., Disp: , Rfl:  .  FLUoxetine (PROZAC) 20 MG tablet, TAKE 1 TABLET (20 MG TOTAL) BY MOUTH DAILY., Disp: 90 tablet, Rfl: 2 .  TECFIDERA 240 MG CPDR, TAKE 1 CAPSULE BY MOUTH TWICE DAILY -STORE AT ROOM TEMPERATURE IN ORIGINAL CONTAINER. DO NOT CRUSH OR CHEW. SWALLOW WHOLE, Disp: 180 capsule, Rfl: 3   PAST MEDICAL HISTORY: Past Medical History:  Diagnosis Date  . Depression   . MS (multiple sclerosis) ([place])    sees Dr Kerman Passey in [facility]    PAST SURGICAL HISTORY: No past surgical history on file.  FAMILY HISTORY: Family History  Problem Relation Age of Onset  . Drug abuse Mother   . Arthritis Father   . Arthritis/Rheumatoid Father   . Seizures Maternal Aunt   . Parkinsonism Maternal Grandfather   . Lupus Maternal Aunt     SOCIAL HISTORY:  Social History   Social History  . Marital status: Married    Spouse name: Jeneen Rinks  . Number of children: 1  . Years of education: 41   Occupational History  . Education officer, museum    Social History Main Topics  . Smoking status: Never Smoker  . Smokeless tobacco: Never Used  . Alcohol use 0.5 oz/week    1 Standard drinks or equivalent per week  . Drug use: No  . Sexual activity: Yes    Partners: Male    Birth control/ protection: Surgical   Other Topics Concern  . Not on file   Social History Narrative  . No narrative on file     PHYSICAL EXAM  Vitals:   01/03/17 1609  BP: 117/83  Pulse: 90  Resp: 14  Weight: 142 lb (64.4 kg)  Height: 5\' 2"  (1.575 m)    Body mass index is 25.97 kg/m.   General: The patient is well-developed and well-nourished and in no acute distress  Neurologic Exam  Mental status: The patient is alert and oriented x 3 at the time of the examination. The patient has apparent normal recent and remote memory, with an apparently normal attention span and concentration ability.   Speech is  normal.  Cranial nerves: Extraocular movements are full.   Facial symmetry is present. There is good facial sensation to soft touch bilaterally.Facial strength is normal.    No dysarthria is noted.    No obvious hearing deficits are noted.  Motor:  No tremors or fasciculations were noted. Muscle bulk and tone are normal. Strength is  5 / 5 in all 4 extremities.   Sensory: Sensory testing is intact to touch in all 4 extremities.  Coordination: Cerebellar testing reveals good finger-nose-finger bilaterally.  Gait and station: Station and gait are normal. Tandem gait is normal. Romberg is negative.   Reflexes: Deep tendon reflexes are symmetric and increased in legs bilaterally, slightly more on her left.  There was spread at the knees.     DIAGNOSTIC DATA (LABS,  IMAGING, TESTING) - I reviewed patient records, labs, notes, testing and imaging myself where available.     ASSESSMENT AND PLAN  MS (multiple sclerosis) (HCC)  Other fatigue  Benign fasciculations  High risk medication use    1.  Continue Tecfidera.   Her MRI and lymphocyte counts have been stable. 2.   Continue Prozac and Adderall. 3.   rtc 6 months or sooner if problems   Matt Delpizzo A. Felecia Shelling, MD, PhD [id number], 123456 PM Certified in Neurology, Clinical Neurophysiology, Sleep Medicine, Pain Medicine and Neuroimaging  [facility] [address], [place], [place] [address] [phone]

## 2017-01-28 ENCOUNTER — Telehealth: Payer: Self-pay | Admitting: *Deleted

## 2017-01-28 ENCOUNTER — Encounter: Payer: Self-pay | Admitting: Neurology

## 2017-01-28 MED ORDER — AMPHETAMINE-DEXTROAMPHETAMINE 10 MG PO TABS: ORAL_TABLET | ORAL | 0 refills | Status: DC

## 2017-01-28 NOTE — Telephone Encounter (Signed)
Adderall rx. up front [facility]/fim 

## 2017-02-27 ENCOUNTER — Encounter: Payer: Self-pay | Admitting: Neurology

## 2017-02-28 ENCOUNTER — Telehealth: Payer: Self-pay | Admitting: *Deleted

## 2017-02-28 MED ORDER — AMPHETAMINE-DEXTROAMPHETAMINE 10 MG PO TABS: ORAL_TABLET | ORAL | 0 refills | Status: DC

## 2017-02-28 NOTE — Telephone Encounter (Signed)
Adderall rx. up front [facility] per emailed request/fim 

## 2017-03-27 ENCOUNTER — Encounter: Payer: Self-pay | Admitting: Neurology

## 2017-03-28 ENCOUNTER — Telehealth: Payer: Self-pay | Admitting: *Deleted

## 2017-03-28 MED ORDER — AMPHETAMINE-DEXTROAMPHETAMINE 10 MG PO TABS: ORAL_TABLET | ORAL | 0 refills | Status: DC

## 2017-03-28 NOTE — Telephone Encounter (Signed)
Rx. awaiting YY sig, as RAS is ooo this wk/fim

## 2017-05-24 ENCOUNTER — Telehealth: Payer: Self-pay | Admitting: *Deleted

## 2017-05-24 ENCOUNTER — Encounter: Payer: Self-pay | Admitting: Neurology

## 2017-05-24 MED ORDER — AMPHETAMINE-DEXTROAMPHETAMINE 10 MG PO TABS: ORAL_TABLET | ORAL | 0 refills | Status: DC

## 2017-05-24 NOTE — Telephone Encounter (Signed)
Adderall rx. up front [facility], per emailed request/fim

## 2017-05-27 ENCOUNTER — Telehealth: Payer: Self-pay | Admitting: *Deleted

## 2017-05-27 MED ORDER — DIMETHYL FUMARATE 240 MG PO CPDR: DELAYED_RELEASE_CAPSULE | ORAL | 3 refills | Status: DC

## 2017-05-27 NOTE — Telephone Encounter (Signed)
Tecfidera rx. escribed to [facility] per emailed request/fim

## 2017-07-01 ENCOUNTER — Encounter: Payer: Self-pay | Admitting: Neurology

## 2017-07-04 ENCOUNTER — Ambulatory Visit: Payer: 59 | Admitting: Neurology

## 2017-08-19 ENCOUNTER — Telehealth: Payer: Self-pay | Admitting: *Deleted

## 2017-08-19 NOTE — Telephone Encounter (Signed)
PA for Tecfidera 240mg  #60/30 completed and faxed to [facility], fax# [phone]/fim

## 2017-08-22 NOTE — Telephone Encounter (Signed)
Fax received from [facility] (phone# [phone]).  Tecfidera approved thru 08/19/22.  Ref# [id number].  Copy of approval letter faxed to [place], fax# [phone]

## 2017-09-03 ENCOUNTER — Encounter: Payer: Self-pay | Admitting: Neurology

## 2017-09-03 ENCOUNTER — Telehealth: Payer: Self-pay | Admitting: *Deleted

## 2017-09-03 MED ORDER — DIMETHYL FUMARATE 240 MG PO CPDR: DELAYED_RELEASE_CAPSULE | ORAL | 3 refills | Status: DC

## 2017-09-03 NOTE — Telephone Encounter (Signed)
Tecfidera escribed to [facility]. Pharm. per pt's request/fim

## 2017-09-04 ENCOUNTER — Encounter: Payer: 59 | Admitting: Pharmacist

## 2017-09-09 ENCOUNTER — Encounter: Payer: Self-pay | Admitting: Neurology

## 2017-09-10 ENCOUNTER — Telehealth: Payer: Self-pay | Admitting: Neurology

## 2017-09-10 NOTE — Telephone Encounter (Signed)
[email or web address]  Support has called for a prior Authorization for the Dimethyl Fumarate (TECFIDERA) 240 MG CPDR.  She stated that a verbal authorization can be called into [id number]

## 2017-09-10 NOTE — Telephone Encounter (Signed)
Pt. came in and gave copy of current ins. card--[employer], ID: [id number], Group # [id number]. I spoke with Truman Hayward at [facility]. PA completed by phone. Tried and failed meds: Betaseron (relapse), and Tysabri (high JCV ab).  PA# [id number]. Determination will be received in 24-72 business hrs/fim

## 2017-09-10 NOTE — Telephone Encounter (Signed)
LMOM--I need new ins. info. (Name of ins. co--I think it is [employer], ID#, pharmacy help desk # on back of card./fim

## 2017-09-16 NOTE — Telephone Encounter (Signed)
Fax received from [place].  Tecfidera PA approved for dates 09/16/17 thru 09/15/18.  Ref# [id number]. Plan Code: [id number].  Rx. must be filled at [facility], phone# [phone]

## 2017-10-16 ENCOUNTER — Encounter: Payer: Self-pay | Admitting: Neurology

## 2017-10-17 ENCOUNTER — Other Ambulatory Visit: Payer: Self-pay

## 2017-10-17 ENCOUNTER — Encounter: Payer: Self-pay | Admitting: Neurology

## 2017-10-17 ENCOUNTER — Ambulatory Visit (INDEPENDENT_AMBULATORY_CARE_PROVIDER_SITE_OTHER): Payer: 59 | Admitting: Neurology

## 2017-10-17 VITALS — BP 114/70 | HR 68 | Resp 14 | Ht 62.0 in | Wt 155.5 lb

## 2017-10-17 DIAGNOSIS — Z79899 Other long term (current) drug therapy: Secondary | ICD-10-CM | POA: Diagnosis not present

## 2017-10-17 DIAGNOSIS — F419 Anxiety disorder, unspecified: Secondary | ICD-10-CM | POA: Insufficient documentation

## 2017-10-17 DIAGNOSIS — G35 Multiple sclerosis: Secondary | ICD-10-CM | POA: Diagnosis not present

## 2017-10-17 DIAGNOSIS — R5383 Other fatigue: Secondary | ICD-10-CM | POA: Diagnosis not present

## 2017-10-17 MED ORDER — AMPHETAMINE-DEXTROAMPHETAMINE 10 MG PO TABS: ORAL_TABLET | ORAL | 0 refills | Status: DC

## 2017-10-17 MED ORDER — FLUOXETINE HCL 20 MG PO TABS: ORAL_TABLET | ORAL | 2 refills | Status: DC

## 2017-10-17 NOTE — Progress Notes (Signed)
[facility]  PATIENT: Laurie Cole DOB: [birth year]  REFERRING CLINICIAN: Loyal Gambler HISTORY FROM: Patient REASON FOR VISIT: MS   HISTORICAL  CHIEF COMPLAINT:  Chief Complaint  Patient presents with  . Multiple Sclerosis    Sts. she continues to tolerate Tecfidera well. Concerned with recent wt. gain/fim    HISTORY OF PRESENT ILLNESS:  Laurie Cole is a 41 year old woman with MS diagnosed in 2006.     Update 10/17/2017:   She is on Tecfidera for her MS. She tolerates it well. She has not had any exacerbations. He now and then she will have some flushing. Lymphocytes are checked on a regular basis and they have been in the normal range. The MRI of the brain 12/29/2016 showed white matter foci consistent with her diagnosis of MS but there were no changes when compared to the previous MRI. She denies any difficulty with her gait. There is no numbness, weakness or change in her vision. Bladder is fine. Her fatigue is doing fairly well. Adderall helps. She is concerned about some recent weight gain and is going to try to eat better and exercise more. Of note, she eats more in the morning than she normally would as it reduces the flushing with the Tecfidera. She has tried some days moving her first dose of Tecfidera to after lunch and does better. Mood and cognition are doing well.                                                           From 01/03/2017:   MS:  For MS is stable. She has no new exacerbations. She has been on Tecfidera since 2013 and she tolerates it reasonably well but continues to have some flushing    Lymphocytes have been checked every 6 months and have been in the normal range.   Flushing is better with moving the timing of her pills and taking aspirin.      Her MRI of the brain 12/29/2016 with and without contrast shows T2/FLAIR hyperintense foci in a pattern and configuration consistent with chronic demyelinating plaques of MS. There is no interval  change (compared to 12/2015) and there are no acute findings on the current MRI.         Muscle fasciculations:  She has occasional muscle fasciculations, worse when tired.  Her EMG/NCV was normal.      She denies any significant issues with gait, numbness, weakness or vision. She did have numbness in the past at the time of diagnosis.  She has no bladder urgency or frequency.  Fatigue/sleep:  Her fatigue is doing better since she is exercising more     She has some heat intolerance with more fatigue. As needed, she takes Adderall 10 mg by mouth twice a day with benefit. She denies any side effects.  She sleeps well most nights.  Mood:   She has been on Prozac 20 mg and tolerates it well.   She feels mood is doing very well.    She denies any significant cognitive issues.    Vit D:   She is on vit D supplements of a mild deficiency.  MS History:  who presented with a Lhermite sign, visual changes and numbness in 2006. MRIs were performed that were consistent with multiple sclerosis. Additionally,  visual evoked potentials showed slowing of the P100. She was placed on Betaseron. She stopped when she was pregnant about 2 years later. She returned to Betaseron for about another year and one half after her son was born. Then she had some breakthrough relapses and she was switched to Tysabri and tolerated it very well. Unfortunately, after a couple years she converted from negative to positive at a high titer.   She does not have any exacerbations while on Tysabri. She then switched to Tecfidera. She's been on Tecfidera about 3 years now and has tolerated it well. She has not had any exacerbations while on it. MRIs have been stable.    REVIEW OF SYSTEMS:  Constitutional: No fevers, chills, sweats, or change in appetite.  Notes  Fatigue Eyes: No visual changes, double vision, eye pain Ear, nose and throat: No hearing loss, ear pain, nasal congestion, sore throat Cardiovascular: No chest pain,  palpitations Respiratory:  No shortness of breath at rest or with exertion.   No wheezes GastrointestinaI: No nausea, vomiting, diarrhea, abdominal pain, fecal incontinence Genitourinary:  No dysuria, urinary retention or frequency.  No nocturia. Musculoskeletal:  No neck pain, back pain.   Reports a muscle spasm sensation in the right hip. Integumentary: No rash, pruritus, skin lesions Neurological: as above Psychiatric: Notes  depression at this time.  No anxiety Endocrine: No palpitations, diaphoresis, change in appetite, change in weigh or increased thirst Hematologic/Lymphatic:  No anemia, purpura, petechiae. Allergic/Immunologic: No itchy/runny eyes, nasal congestion, recent allergic reactions, rashes  ALLERGIES: No Known Allergies  HOME MEDICATIONS:  Current Outpatient Medications:  .  amphetamine-dextroamphetamine (ADDERALL) 10 MG tablet, Take one tablet 3 times daily as needed., Disp: 90 tablet, Rfl: 0 .  Biotin 10000 MCG TABS, Take by mouth., Disp: , Rfl:  .  cholecalciferol (VITAMIN D) 1000 units tablet, Take 5,000 Units by mouth daily., Disp: , Rfl:  .  Dimethyl Fumarate (TECFIDERA) 240 MG CPDR, TAKE 1 CAPSULE BY MOUTH TWICE DAILY -STORE AT ROOM TEMPERATURE IN ORIGINAL CONTAINER. DO NOT CRUSH OR CHEW. SWALLOW WHOLE, Disp: 180 capsule, Rfl: 3 .  FLUoxetine (PROZAC) 20 MG tablet, Take 1/2 to 1 TABLET daily, Disp: 90 tablet, Rfl: 2   PAST MEDICAL HISTORY: Past Medical History:  Diagnosis Date  . Depression   . MS (multiple sclerosis) ([place])    sees Dr Kerman Passey in [facility]    PAST SURGICAL HISTORY: History reviewed. No pertinent surgical history.  FAMILY HISTORY: Family History  Problem Relation Age of Onset  . Drug abuse Mother   . Arthritis Father   . Arthritis/Rheumatoid Father   . Seizures Maternal Aunt   . Parkinsonism Maternal Grandfather   . Lupus Maternal Aunt     SOCIAL HISTORY:  Social History   Socioeconomic History  . Marital status: Married    Spouse  name: Jeneen Rinks  . Number of children: 1  . Years of education: 90  . Highest education level: Not on file  Social Needs  . Financial resource strain: Not on file  . Food insecurity - worry: Not on file  . Food insecurity - inability: Not on file  . Transportation needs - medical: Not on file  . Transportation needs - non-medical: Not on file  Occupational History  . Occupation: Education officer, museum  Tobacco Use  . Smoking status: Never Smoker  . Smokeless tobacco: Never Used  Substance and Sexual Activity  . Alcohol use: Yes    Alcohol/week: 0.5 oz    Types: 1 Standard drinks  or equivalent per week  . Drug use: No  . Sexual activity: Yes    Partners: Male    Birth control/protection: Surgical  Other Topics Concern  . Not on file  Social History Narrative  . Not on file     PHYSICAL EXAM  Vitals:   10/17/17 0857  BP: 114/70  Pulse: 68  Resp: 14  Weight: 155 lb 8 oz (70.5 kg)  Height: 5\' 2"  (1.575 m)    Body mass index is 28.44 kg/m.   General: The patient is well-developed and well-nourished and in no acute distress  Neurologic Exam  Mental status: The patient is alert and oriented x 3 at the time of the examination. The patient has apparent normal recent and remote memory, with an apparently normal attention span and concentration ability.   Speech is normal.  Cranial nerves: Extraocular movements are full.   Facial symmetry is present. There is good facial sensation to soft touch bilaterally.Facial strength is normal.    No dysarthria is noted.    No obvious hearing deficits are noted.  Motor:  There are no fasciculations. Muscle bulk and muscle tone are normal. Strength is 5/5. \  Sensory: Sensory testing is intact to touch in all 4 extremities.  Coordination: Cerebellar testing reveals good finger-nose-finger bilaterally.  Gait and station: Station and gait are normal. Tandem gait is normal. Romberg is negative.   Reflexes: Deep tendon reflexes are mildly  increased in the legs. There is spread at the knees. No ankle clonus.    DIAGNOSTIC DATA (LABS, IMAGING, TESTING) - I reviewed patient records, labs, notes, testing and imaging myself where available.     ASSESSMENT AND PLAN  MS (multiple sclerosis) ([place]) - Plan: CBC with Differential/Platelet, T-helper cells CD4/CD8 %, CANCELED: CD4 Count  High risk medication use - Plan: CBC with Differential/Platelet, T-helper cells CD4/CD8 %, CANCELED: CD4 Count  Other fatigue  Anxiety    1.  Continue Tecfidera.   We will check a CBC and CD4/CD8.. 2.   Continue Prozac and Adderall.   Refills were provided.   We discussed considering phentermine instead of Adderall as it can help weight and also is a stimulant. 3.   rtc 6 months or sooner if problems   Richard A. Felecia Shelling, MD, PhD 17/61/6073, 7:10 PM Certified in Neurology, Clinical Neurophysiology, Sleep Medicine, Pain Medicine and Neuroimaging  [facility] [address], [place], [place] [address] [phone]

## 2017-10-18 LAB — T-HELPER CELLS CD4/CD8 %
% CD 4 POS. LYMPH.: 55.2 % (ref 30.8–58.5)
ABSOLUTE CD 4 HELPER: 773 /uL (ref 359–1519)
BASOS: 0 %
Basophils Absolute: 0 10*3/uL (ref 0.0–0.2)
CD3+CD4+ CELLS/CD3+CD8+ CELLS BLD: 3.43 (ref 0.92–3.72)
CD3+CD8+ CELLS NFR BLD: 16.1 % (ref 12.0–35.5)
CD3+CD8+ Cells # Bld: 225 /uL (ref 109–897)
EOS (ABSOLUTE): 0.1 10*3/uL (ref 0.0–0.4)
EOS: 2 %
HEMATOCRIT: 40 % (ref 34.0–46.6)
HEMOGLOBIN: 13.4 g/dL (ref 11.1–15.9)
IMMATURE GRANS (ABS): 0 10*3/uL (ref 0.0–0.1)
IMMATURE GRANULOCYTES: 0 %
LYMPHS ABS: 1.4 10*3/uL (ref 0.7–3.1)
Lymphs: 21 %
MCH: 33.1 pg — AB (ref 26.6–33.0)
MCHC: 33.5 g/dL (ref 31.5–35.7)
MCV: 99 fL — ABNORMAL HIGH (ref 79–97)
MONOS ABS: 0.5 10*3/uL (ref 0.1–0.9)
Monocytes: 8 %
NEUTROS PCT: 69 %
Neutrophils Absolute: 4.7 10*3/uL (ref 1.4–7.0)
Platelets: 249 10*3/uL (ref 150–379)
RBC: 4.05 x10E6/uL (ref 3.77–5.28)
RDW: 13.9 % (ref 12.3–15.4)
WBC: 6.7 10*3/uL (ref 3.4–10.8)

## 2017-10-21 ENCOUNTER — Encounter: Payer: Self-pay | Admitting: *Deleted

## 2017-11-09 ENCOUNTER — Encounter: Payer: Self-pay | Admitting: Emergency Medicine

## 2017-11-09 ENCOUNTER — Emergency Department (INDEPENDENT_AMBULATORY_CARE_PROVIDER_SITE_OTHER)
Admission: EM | Admit: 2017-11-09 | Discharge: 2017-11-09 | Disposition: A | Discharge status: Home / Self Care | Payer: 59 | Source: Home / Self Care | Attending: Family Medicine | Admitting: Family Medicine

## 2017-11-09 ENCOUNTER — Other Ambulatory Visit: Payer: Self-pay

## 2017-11-09 DIAGNOSIS — L989 Disorder of the skin and subcutaneous tissue, unspecified: Secondary | ICD-10-CM

## 2017-11-09 MED ORDER — CEPHALEXIN 500 MG PO CAPS: 500.0000 mg | ORAL_CAPSULE | Freq: Two times a day (BID) | ORAL | 0 refills | Status: DC

## 2017-11-09 NOTE — Discharge Instructions (Signed)
Apply warm compress two to three times daily.

## 2017-11-09 NOTE — ED Triage Notes (Signed)
Patient had sense of pressure in both ears earlier today; found lump behind left ear and tried to drain it but was unsuccessful. Had one spell of vertigo, but has this occasionally due to MS so uncertain. Has taken sudafed, aleve and dramamine today.

## 2017-11-09 NOTE — ED Provider Notes (Signed)
[facility]    CSN: [id number] Arrival date & time: 11/09/17  1704     History   Chief Complaint Chief Complaint  Patient presents with  . Otalgia    and lump behind left ear  . Dizziness    HPI Laurie Cole is a 41 y.o. female.   Patient became aware of a non-tender lump above her left ear last night.  She tried to drain it without success.  She had a mild sensation of pressure in her ears today, and an episode of vertigo resolved with Dramamine.  She has a history of seasonal allergic rhinitis.  She feels well otherwise.   The history is provided by the patient.    Past Medical History:  Diagnosis Date  . Depression   . MS (multiple sclerosis) ([place])    sees Dr Kerman Passey in [facility]    Patient Active Problem List   Diagnosis Date Noted  . Anxiety 10/17/2017  . Benign fasciculations 12/28/2015  . High risk medication use 05/24/2015  . Other fatigue 01/14/2015  . Depression 01/14/2015  . IBS (irritable bowel syndrome) 08/17/2011  . MS (multiple sclerosis) ([place]) 08/17/2011    History reviewed. No pertinent surgical history.  OB History    No data available       Home Medications    Prior to Admission medications   Medication Sig Start Date End Date Taking? Authorizing Provider  amphetamine-dextroamphetamine (ADDERALL) 10 MG tablet Take one tablet 3 times daily as needed. 10/17/17   Sater, Nanine Means, MD  Biotin 10000 MCG TABS Take by mouth.    [provider]  cephALEXin (KEFLEX) 500 MG capsule Take 1 capsule (500 mg total) by mouth 2 (two) times daily. 11/09/17   Kandra Nicolas, MD  cholecalciferol (VITAMIN D) 1000 units tablet Take 5,000 Units by mouth daily.    [provider]  Dimethyl Fumarate (TECFIDERA) 240 MG CPDR TAKE 1 CAPSULE BY MOUTH TWICE DAILY -STORE AT ROOM TEMPERATURE IN ORIGINAL CONTAINER. DO NOT CRUSH OR CHEW. SWALLOW WHOLE 09/03/17   Sater, Nanine Means, MD  FLUoxetine (PROZAC) 20 MG tablet Take 1/2 to 1 TABLET daily  10/17/17   Sater, Nanine Means, MD    Family History Family History  Problem Relation Age of Onset  . Drug abuse Mother   . Arthritis Father   . Arthritis/Rheumatoid Father   . Seizures Maternal Aunt   . Parkinsonism Maternal Grandfather   . Lupus Maternal Aunt     Social History Social History   Tobacco Use  . Smoking status: Never Smoker  . Smokeless tobacco: Never Used  Substance Use Topics  . Alcohol use: Yes    Alcohol/week: 0.5 oz    Types: 1 Standard drinks or equivalent per week  . Drug use: No     Allergies   Patient has no known allergies.   Review of Systems Review of Systems No sore throat No cough No pleuritic pain No wheezing + nasal congestion + post-nasal drainage No sinus pain/pressure No itchy/red eyes ? earache No hemoptysis No SOB No fever/chills + vertigo, resolved No nausea No vomiting No abdominal pain No diarrhea No urinary symptoms No skin rash + skin lesion No fatigue No myalgias No headache    Physical Exam Triage Vital Signs ED Triage Vitals [11/09/17 1723]  Enc Vitals Group     BP 108/72     Pulse Rate 93     Resp 16     Temp 98.1 F (36.7  C)     Temp Source Oral     SpO2 99 %     Weight 150 lb (68 kg)     Height 5\' 3"  (1.6 m)     Head Circumference      Peak Flow      Pain Score      Pain Loc      Pain Edu?      Excl. in [place]?    No data found.  Updated Vital Signs BP 108/72 (BP Location: Right Arm)   Pulse 93   Temp 98.1 F (36.7 C) (Oral)   Resp 16   Ht 5\' 3"  (1.6 m)   Wt 150 lb (68 kg)   LMP 11/02/2017 (Exact Date)   SpO2 99%   BMI 26.57 kg/m   Visual Acuity Right Eye Distance:   Left Eye Distance:   Bilateral Distance:    Right Eye Near:   Left Eye Near:    Bilateral Near:     Physical Exam  Constitutional: She appears well-developed and well-nourished. No distress.  HENT:  Head:    Right Ear: External ear normal.  Left Ear: External ear normal.  Nose: Nose normal.    Mouth/Throat: Oropharynx is clear and moist.  On scalp just superior to the left ear is a benign appearing soft non-tender nodule with consistency suggesting a lipoma.  Lesion measures 1cm by 2cm  Eyes: Conjunctivae and EOM are normal. Pupils are equal, round, and reactive to light.  Neck: Normal range of motion. Neck supple.  Cardiovascular: Normal rate.  Pulmonary/Chest: Effort normal.  Lymphadenopathy:    She has no cervical adenopathy.  Neurological: She is alert.  Skin: Skin is warm and dry.  Nursing note and vitals reviewed.    UC Treatments / Results  Labs (all labs ordered are listed, but only abnormal results are displayed) Labs Reviewed - No data to display  EKG  EKG Interpretation None       Radiology No results found.  Procedures Procedures (including critical care time)  Medications Ordered in UC Medications - No data to display   Initial Impression / Assessment and Plan / UC Course  I have reviewed the triage vital signs and the nursing notes.  Pertinent labs & imaging results that were available during my care of the patient were reviewed by me and considered in my medical decision making (see chart for details).    Cystic appearing lesion above left ear suggestive of lipoma, but less likely because of its rapid appearance. Although probably not infected, will begin empiric Keflex. Apply warm compress two to three times daily. Followup with dermatologist if not improved one week.    Final Clinical Impressions(s) / UC Diagnoses   Final diagnoses:  Skin lesion of scalp    ED Discharge Orders        Ordered    cephALEXin (KEFLEX) 500 MG capsule  2 times daily     11/09/17 1824           Kandra Nicolas, MD 11/12/17 [phone]

## 2017-11-11 MED FILL — AMPHETAMINE SALTS 10 MG TAB: 10 | 45 days supply | Qty: 90 | Fill #0

## 2017-11-14 ENCOUNTER — Telehealth: Payer: Self-pay

## 2017-11-14 ENCOUNTER — Encounter: Payer: Self-pay | Admitting: Family Medicine

## 2017-11-14 ENCOUNTER — Ambulatory Visit (INDEPENDENT_AMBULATORY_CARE_PROVIDER_SITE_OTHER): Payer: 59 | Admitting: Family Medicine

## 2017-11-14 VITALS — BP 102/62 | HR 84 | Temp 98.2°F | Ht 63.0 in | Wt 153.5 lb

## 2017-11-14 DIAGNOSIS — L989 Disorder of the skin and subcutaneous tissue, unspecified: Secondary | ICD-10-CM | POA: Diagnosis not present

## 2017-11-14 NOTE — Telephone Encounter (Signed)
Behind L ear, requested as soon as possible, not as urgent request-Mainly due to patient anxiety. TY.  Copied from [place]. Topic: Referral - Question >> Nov 14, 2017  2:34 PM Bea Graff, NT wrote: Reason for CRM: [facility] received referral for skin lesion but needs more information. Where, why soon? CB#: [phone] ask for Juliann Pulse

## 2017-11-14 NOTE — Patient Instructions (Addendum)
If you do not hear anything about your referral in the next week, call our office and ask for an update.  Let us know if you need anything.  

## 2017-11-14 NOTE — Progress Notes (Signed)
Chief Complaint  Patient presents with  . Follow-up    lump behind left ear       New Patient Visit SUBJECTIVE: HPI: Laurie Cole is an 41 y.o.female who is being seen for establishing care. She is going to establish with Dr Charlett Blake of this office in around 1 mo.   6 d ago noticed a bump on back of L ear. She has a famhx of skin cancer, so this worried her. No pain, redness, drainage, fevers, recent illness, change since this started. Went to UC after she tried to drain it with needle and placed on abx. Again, no difference noted. No new topicals. This has never happened before.   No Known Allergies  Past Medical History:  Diagnosis Date  . Depression   . MS (multiple sclerosis) (Joice)    sees Dr Kerman Passey in [facility]   History reviewed. No pertinent surgical history. Social History   Socioeconomic History  . Marital status: Married    Spouse name: Jeneen Rinks  . Number of children: 1  . Years of education: 15  Occupational History  . Occupation: Education officer, museum  Tobacco Use  . Smoking status: Never Smoker  . Smokeless tobacco: Never Used  Substance and Sexual Activity  . Alcohol use: Yes    Alcohol/week: 0.5 oz    Types: 1 Standard drinks or equivalent per week  . Drug use: No  . Sexual activity: Yes    Partners: Male    Birth control/protection: Surgical   Family History  Problem Relation Age of Onset  . Drug abuse Mother   . Arthritis Father   . Arthritis/Rheumatoid Father   . Seizures Maternal Aunt   . Parkinsonism Maternal Grandfather   . Lupus Maternal Aunt      Current Outpatient Medications:  .  amphetamine-dextroamphetamine (ADDERALL) 10 MG tablet, Take one tablet 3 times daily as needed., Disp: 90 tablet, Rfl: 0 .  Biotin 10000 MCG TABS, Take by mouth., Disp: , Rfl:  .  cephALEXin (KEFLEX) 500 MG capsule, Take 1 capsule (500 mg total) by mouth 2 (two) times daily., Disp: 14 capsule, Rfl: 0 .  cholecalciferol (VITAMIN D) 1000 units tablet, Take 5,000 Units by mouth  daily., Disp: , Rfl:  .  Dimethyl Fumarate (TECFIDERA) 240 MG CPDR, TAKE 1 CAPSULE BY MOUTH TWICE DAILY -STORE AT ROOM TEMPERATURE IN ORIGINAL CONTAINER. DO NOT CRUSH OR CHEW. SWALLOW WHOLE, Disp: 180 capsule, Rfl: 3 .  FLUoxetine (PROZAC) 20 MG tablet, Take 1/2 to 1 TABLET daily, Disp: 90 tablet, Rfl: 2  Patient's last menstrual period was 11/02/2017 (exact date).  ROS Const Denies fevers  Skin: As noted in HPI   OBJECTIVE: BP 102/62 (BP Location: Left Arm, Patient Position: Sitting, Cuff Size: Normal)   Pulse 84   Temp 98.2 F (36.8 C) (Oral)   Ht 5\' 3"  (1.6 m)   Wt 153 lb 8 oz (69.6 kg)   LMP 11/02/2017 (Exact Date)   SpO2 97%   BMI 27.19 kg/m   Constitutional: -  VS reviewed -  Well developed, well nourished, appears stated age -  No apparent distress  Psychiatric: -  Oriented to person, place, and time -  Memory intact -  Affect and mood normal -  Fluent conversation, good eye contact -  Judgment and insight age appropriate  Eye: -  Conjunctivae clear, no discharge -  Pupils symmetric, round, reactive to light  ENMT: -  MMM    Pharynx moist, no exudate, no erythema  Neck: -  No gross swelling, no palpable masses -  Thyroid midline, not enlarged, mobile, no palpable masses  Cardiovascular: -  RRR -  No LE edema  Respiratory: -  Normal respiratory effort, no accessory muscle use, no retraction -  Breath sounds equal, no wheezes, no ronchi, no crackles  Skin: -  See below- it is raised and flesh colored -  The area is not TTP, excessively warm, fluctuant -  The area is well circumscribed and freely moveable -  Warm and dry to palpation   Media Information     ASSESSMENT/PLAN: Skin lesion - Plan: Ambulatory referral to Dermatology  Feels like a lipoma. Discussed options of monitoring, [place] vs referral to specialist. She would like to see derm for their opinion and hopeful/possible excision/biopsy. Will place order.  Patient should return as originally scheduled  with Dr. Charlett Blake. The patient voiced understanding and agreement to the plan.   [place], DO 11/14/17  8:40 AM

## 2017-11-14 NOTE — Progress Notes (Signed)
Pre visit review using our clinic review tool, if applicable. No additional management support is needed unless otherwise documented below in the visit note. 

## 2017-11-14 NOTE — Telephone Encounter (Signed)
Gwen-could you refer this patient to another derm to see if she can get in before February.

## 2017-11-14 NOTE — Telephone Encounter (Signed)
Called [place] derm informed of PCP instructions They will schedule her and call her

## 2017-12-16 ENCOUNTER — Encounter: Payer: Self-pay | Admitting: Family Medicine

## 2017-12-16 ENCOUNTER — Ambulatory Visit (INDEPENDENT_AMBULATORY_CARE_PROVIDER_SITE_OTHER): Payer: 59 | Admitting: Family Medicine

## 2017-12-16 VITALS — BP 118/76 | HR 84 | Temp 97.9°F | Resp 16 | Ht 62.99 in | Wt 153.2 lb

## 2017-12-16 DIAGNOSIS — F329 Major depressive disorder, single episode, unspecified: Secondary | ICD-10-CM | POA: Diagnosis not present

## 2017-12-16 DIAGNOSIS — R51 Headache: Secondary | ICD-10-CM | POA: Diagnosis not present

## 2017-12-16 DIAGNOSIS — Z1231 Encounter for screening mammogram for malignant neoplasm of breast: Secondary | ICD-10-CM | POA: Diagnosis not present

## 2017-12-16 DIAGNOSIS — Z0000 Encounter for general adult medical examination without abnormal findings: Secondary | ICD-10-CM

## 2017-12-16 DIAGNOSIS — K219 Gastro-esophageal reflux disease without esophagitis: Secondary | ICD-10-CM

## 2017-12-16 DIAGNOSIS — K5900 Constipation, unspecified: Secondary | ICD-10-CM | POA: Diagnosis not present

## 2017-12-16 DIAGNOSIS — F32A Depression, unspecified: Secondary | ICD-10-CM

## 2017-12-16 DIAGNOSIS — Z1239 Encounter for other screening for malignant neoplasm of breast: Secondary | ICD-10-CM

## 2017-12-16 DIAGNOSIS — R519 Headache, unspecified: Secondary | ICD-10-CM

## 2017-12-16 MED ORDER — LINACLOTIDE 72 MCG PO CAPS: 72.0000 ug | ORAL_CAPSULE | Freq: Every day | ORAL | 2 refills | Status: DC

## 2017-12-16 MED FILL — LINZESS 72 MCG CAPSULE: 72 | 30 days supply | Qty: 30 | Fill #0

## 2017-12-16 NOTE — Patient Instructions (Addendum)
Miralax mixed with Benefiber once to twice daily. Encouraged increased hydration and fiber in diet. Daily probiotics. If bowels not moving can use MOM 2 tbls po in 4 oz of warm prune juice by mouth every 2-3 days. If no results then repeat in 4 hours with  Dulcolax suppository pr, may repeat again in 4 more hours as needed. Seek care if symptoms worsen. Consider daily Miralax and/or Dulcolax if symptoms persist.   Preventive Care 18-39 Years, Female Preventive care refers to lifestyle choices and visits with your health care provider that can promote health and wellness. What does preventive care include?  A yearly physical exam. This is also called an annual well check.  Dental exams once or twice a year.  Routine eye exams. Ask your health care provider how often you should have your eyes checked.  Personal lifestyle choices, including: ? Daily care of your teeth and gums. ? Regular physical activity. ? Eating a healthy diet. ? Avoiding tobacco and drug use. ? Limiting alcohol use. ? Practicing safe sex. ? Taking vitamin and mineral supplements as recommended by your health care provider. What happens during an annual well check? The services and screenings done by your health care provider during your annual well check will depend on your age, overall health, lifestyle risk factors, and family history of disease. Counseling Your health care provider may ask you questions about your:  Alcohol use.  Tobacco use.  Drug use.  Emotional well-being.  Home and relationship well-being.  Sexual activity.  Eating habits.  Work and work Statistician.  Method of birth control.  Menstrual cycle.  Pregnancy history.  Screening You may have the following tests or measurements:  Height, weight, and BMI.  Diabetes screening. This is done by checking your blood sugar (glucose) after you have not eaten for a while (fasting).  Blood pressure.  Lipid and cholesterol levels.  These may be checked every 5 years starting at age 15.  Skin check.  Hepatitis C blood test.  Hepatitis B blood test.  Sexually transmitted disease (STD) testing.  BRCA-related cancer screening. This may be done if you have a family history of breast, ovarian, tubal, or peritoneal cancers.  Pelvic exam and Pap test. This may be done every 3 years starting at age 31. Starting at age 89, this may be done every 5 years if you have a Pap test in combination with an HPV test.  Discuss your test results, treatment options, and if necessary, the need for more tests with your health care provider. Vaccines Your health care provider may recommend certain vaccines, such as:  Influenza vaccine. This is recommended every year.  Tetanus, diphtheria, and acellular pertussis (Tdap, Td) vaccine. You may need a Td booster every 10 years.  Varicella vaccine. You may need this if you have not been vaccinated.  HPV vaccine. If you are 94 or younger, you may need three doses over 6 months.  Measles, mumps, and rubella (MMR) vaccine. You may need at least one dose of MMR. You may also need a second dose.  Pneumococcal 13-valent conjugate (PCV13) vaccine. You may need this if you have certain conditions and were not previously vaccinated.  Pneumococcal polysaccharide (PPSV23) vaccine. You may need one or two doses if you smoke cigarettes or if you have certain conditions.  Meningococcal vaccine. One dose is recommended if you are age 5-21 years and a first-year college student living in a residence hall, or if you have one of several medical conditions. You  may also need additional booster doses.  Hepatitis A vaccine. You may need this if you have certain conditions or if you travel or work in places where you may be exposed to hepatitis A.  Hepatitis B vaccine. You may need this if you have certain conditions or if you travel or work in places where you may be exposed to hepatitis B.  Haemophilus  influenzae type b (Hib) vaccine. You may need this if you have certain risk factors.  Talk to your health care provider about which screenings and vaccines you need and how often you need them. This information is not intended to replace advice given to you by your health care provider. Make sure you discuss any questions you have with your health care provider. Document Released: 01/15/2002 Document Revised: 08/08/2016 Document Reviewed: 09/20/2015 Elsevier Interactive Patient Education  [employer].

## 2017-12-16 NOTE — Assessment & Plan Note (Signed)
Patient encouraged to maintain heart healthy diet, regular exercise, adequate sleep. Consider daily probiotics. Take medications as prescribed. Labs ordered today

## 2017-12-16 NOTE — Progress Notes (Signed)
Subjective:  I acted as a Education administrator for [employer]. Laurie Cole, Laurie Cole   Patient ID: Laurie Cole, female    DOB: 03-May-1976, 42 y.o.   MRN: [id number]  Chief Complaint  Patient presents with  . Establish Care    HPI  Patient is in today to establish care. She has a past medical history of reflux, constipation, depression and multiple sclerosis. She is following with neurology and has been stable. Has had some trouble with headaches as well. No recent febrile illness or acute hospitalizations. She is doing well on fluoxetine. Her dyspepsia is new and she has substernal burning at times. No bloody or tarry stool. She is having intermittent headaches without associated symptoms at times. Denies CP/palp/SOB/congestion/fevers or GU c/o. Taking meds as prescribed  Patient Care Team: Laurie Austin, MD as PCP - General (Family Medicine) Laurie Cole, Laurie Means, MD (Neurology)   Past Medical History:  Diagnosis Date  . Depression   . MS (multiple sclerosis) (Laurie Cole)    sees Dr Laurie Cole in [facility]    Past Surgical History:  Procedure Laterality Date  . LIPOSUCTION      Family History  Problem Relation Age of Onset  . Drug abuse Mother        opiod addiction  . Arthritis Mother        back pain  . Arthritis Father   . Arthritis/Rheumatoid Father   . Ankylosing spondylitis Father   . Seizures Maternal Aunt   . Parkinsonism Maternal Grandfather   . Diabetes Maternal Grandfather   . Lupus Maternal Aunt   . Mental illness Brother        bipolar disorder  . Parkinson's disease Paternal Grandmother   . Arthritis Sister   . Mental illness Sister   . Drug abuse Sister     Social History   Socioeconomic History  . Marital status: Married    Spouse name: Laurie Cole  . Number of children: 1  . Years of education: 11  . Highest education level: Not on file  Social Needs  . Financial resource strain: Not on file  . Food insecurity - worry: Not on file  . Food insecurity - inability: Not on file  .  Transportation needs - medical: Not on file  . Transportation needs - non-medical: Not on file  Occupational History  . Occupation: Education officer, museum  Tobacco Use  . Smoking status: Never Smoker  . Smokeless tobacco: Never Used  Substance and Sexual Activity  . Alcohol use: Yes    Alcohol/week: 0.5 oz    Types: 1 Standard drinks or equivalent per week  . Drug use: No  . Sexual activity: Yes    Partners: Male    Birth control/protection: Surgical  Other Topics Concern  . Not on file  Social History Narrative   Lives with husband and son, works as Education officer, museum, no dietary restrictions, exercises intermittently. Wears a seat belt regularly    Outpatient Medications Prior to Visit  Medication Sig Dispense Refill  . amphetamine-dextroamphetamine (ADDERALL) 10 MG tablet Take one tablet 3 times daily as needed. 90 tablet 0  . Biotin 10000 MCG TABS Take by mouth.    . cephALEXin (KEFLEX) 500 MG capsule Take 1 capsule (500 mg total) by mouth 2 (two) times daily. 14 capsule 0  . cholecalciferol (VITAMIN D) 1000 units tablet Take 5,000 Units by mouth daily.    . Dimethyl Fumarate (TECFIDERA) 240 MG CPDR TAKE 1 CAPSULE BY MOUTH TWICE DAILY -STORE AT ROOM TEMPERATURE IN  ORIGINAL CONTAINER. DO NOT CRUSH OR CHEW. SWALLOW WHOLE 180 capsule 3  . FLUoxetine (PROZAC) 20 MG tablet Take 1/2 to 1 TABLET daily 90 tablet 2   No facility-administered medications prior to visit.     No Known Allergies  Review of Systems  Constitutional: Negative for chills, fever and malaise/fatigue.  HENT: Negative for congestion and hearing loss.   Eyes: Negative for discharge.  Respiratory: Negative for cough, sputum production and shortness of breath.   Cardiovascular: Negative for chest pain, palpitations and leg swelling.  Gastrointestinal: Positive for heartburn. Negative for abdominal pain, blood in stool, constipation, diarrhea, nausea and vomiting.  Genitourinary: Negative for dysuria, frequency, hematuria and  urgency.  Musculoskeletal: Negative for back pain, falls and myalgias.  Skin: Negative for rash.  Neurological: Positive for headaches. Negative for dizziness, sensory change, loss of consciousness and weakness.  Endo/Heme/Allergies: Negative for environmental allergies. Does not bruise/bleed easily.  Psychiatric/Behavioral: Negative for depression and suicidal ideas. The patient is not nervous/anxious and does not have insomnia.        Objective:    Physical Exam  Constitutional: She is oriented to person, place, and time. She appears well-developed and well-nourished. No distress.  HENT:  Head: Normocephalic and atraumatic.  Eyes: Conjunctivae are normal.  Neck: Neck supple. No thyromegaly present.  Cardiovascular: Normal rate, regular rhythm and normal heart sounds.  No murmur heard. Pulmonary/Chest: Effort normal and breath sounds normal. No respiratory distress.  Abdominal: Soft. Bowel sounds are normal. She exhibits no distension and no mass. There is no tenderness.  Musculoskeletal: She exhibits no edema.  Lymphadenopathy:    She has no cervical adenopathy.  Neurological: She is alert and oriented to person, place, and time.  Skin: Skin is warm and dry.  Psychiatric: She has a normal mood and affect. Her behavior is normal.    BP 118/76 (BP Location: Left Arm, Patient Position: Sitting, Cuff Size: Normal)   Pulse 84   Temp 97.9 F (36.6 C) (Oral)   Resp 16   Ht 5' 2.99" (1.6 m)   Wt 153 lb 3.2 oz (69.5 kg)   SpO2 98%   BMI 27.15 kg/m  Wt Readings from Last 3 Encounters:  12/16/17 153 lb 3.2 oz (69.5 kg)  11/14/17 153 lb 8 oz (69.6 kg)  11/09/17 150 lb (68 kg)   BP Readings from Last 3 Encounters:  12/16/17 118/76  11/14/17 102/62  11/09/17 108/72      There is no immunization history on file for this patient.  Health Maintenance  Topic Date Due  . HIV Screening  07/07/1993  . TETANUS/TDAP  07/07/1997  . PAP SMEAR  07/08/1999  . INFLUENZA VACCINE   Completed    Lab Results  Component Value Date   WBC 8.4 12/16/2017   HGB 13.5 12/16/2017   HCT 39.4 12/16/2017   PLT 255.0 12/16/2017   GLUCOSE 88 12/16/2017   CHOL 186 12/16/2017   TRIG 65.0 12/16/2017   HDL 61.50 12/16/2017   LDLCALC 112 (H) 12/16/2017   ALT 10 12/16/2017   AST 18 12/16/2017   NA 137 12/16/2017   K 3.5 12/16/2017   CL 101 12/16/2017   CREATININE 0.68 12/16/2017   BUN 13 12/16/2017   CO2 30 12/16/2017   TSH 2.74 12/16/2017    Lab Results  Component Value Date   TSH 2.74 12/16/2017   Lab Results  Component Value Date   WBC 8.4 12/16/2017   HGB 13.5 12/16/2017   HCT 39.4 12/16/2017  MCV 99.5 12/16/2017   PLT 255.0 12/16/2017   Lab Results  Component Value Date   NA 137 12/16/2017   K 3.5 12/16/2017   CO2 30 12/16/2017   GLUCOSE 88 12/16/2017   BUN 13 12/16/2017   CREATININE 0.68 12/16/2017   BILITOT 1.3 (H) 12/16/2017   ALKPHOS 43 12/16/2017   AST 18 12/16/2017   ALT 10 12/16/2017   PROT 7.4 12/16/2017   ALBUMIN 4.6 12/16/2017   CALCIUM 9.2 12/16/2017   GFR 102.14 12/16/2017   Lab Results  Component Value Date   CHOL 186 12/16/2017   Lab Results  Component Value Date   HDL 61.50 12/16/2017   Lab Results  Component Value Date   LDLCALC 112 (H) 12/16/2017   Lab Results  Component Value Date   TRIG 65.0 12/16/2017   Lab Results  Component Value Date   CHOLHDL 3 12/16/2017   No results found for: HGBA1C       Assessment & Plan:   Problem List Items Addressed This Visit    Depression    Doing well on Fluoxetine no changes      Preventative health care    Patient encouraged to maintain heart healthy diet, regular exercise, adequate sleep. Consider daily probiotics. Take medications as prescribed. Labs ordered today      Relevant Orders   CBC (Completed)   Comprehensive metabolic panel (Completed)   Lipid panel (Completed)   TSH (Completed)   Constipation    Encouraged increased hydration and fiber in diet.  Daily probiotics. If bowels not moving can use MOM 2 tbls po in 4 oz of warm prune juice by mouth every 2-3 days. If no results then repeat in 4 hours with  Dulcolax suppository pr, may repeat again in 4 more hours as needed. Seek care if symptoms worsen. Consider daily Miralax and/or Dulcolax if symptoms persist. Has used Linzess in past with good results. Is given a refill today      Acid reflux    Avoid offending foods, start probiotics. Do not eat large meals in late evening and consider raising head of bed.       Relevant Medications   linaclotide (LINZESS) 72 MCG capsule   Chronic headaches    Encouraged increased hydration, 64 ounces of clear fluids daily. Minimize alcohol and caffeine. Eat small frequent meals with lean proteins and complex carbs. Avoid high and low blood sugars. Get adequate sleep, 7-8 hours a night. Needs exercise daily preferably in the morning.       Other Visit Diagnoses    Breast cancer screening    -  Primary   Relevant Orders   MM SCREENING BREAST TOMO BILATERAL      I am having Cortnee S. Bents start on linaclotide. I am also having her maintain her cholecalciferol, Biotin, Dimethyl Fumarate, amphetamine-dextroamphetamine, FLUoxetine, and cephALEXin.  Meds ordered this encounter  Medications  . linaclotide (LINZESS) 72 MCG capsule    Sig: Take 1 capsule (72 mcg total) by mouth daily before breakfast.    Dispense:  30 capsule    Refill:  2    CMA served as scribe during this visit. History, Physical and Plan performed by medical provider. Documentation and orders reviewed and attested to.  Penni Homans, MD

## 2017-12-17 LAB — COMPREHENSIVE METABOLIC PANEL
ALBUMIN: 4.6 g/dL (ref 3.5–5.2)
ALK PHOS: 43 U/L (ref 39–117)
ALT: 10 U/L (ref 0–35)
AST: 18 U/L (ref 0–37)
BILIRUBIN TOTAL: 1.3 mg/dL — AB (ref 0.2–1.2)
BUN: 13 mg/dL (ref 6–23)
CO2: 30 mEq/L (ref 19–32)
Calcium: 9.2 mg/dL (ref 8.4–10.5)
Chloride: 101 mEq/L (ref 96–112)
Creatinine, Ser: 0.68 mg/dL (ref 0.40–1.20)
GFR: 102.14 mL/min (ref >60.00)
GLUCOSE: 88 mg/dL (ref 70–99)
Potassium: 3.5 mEq/L (ref 3.5–5.1)
SODIUM: 137 meq/L (ref 135–145)
TOTAL PROTEIN: 7.4 g/dL (ref 6.0–8.3)

## 2017-12-17 LAB — CBC
HEMATOCRIT: 39.4 % (ref 36.0–46.0)
HEMOGLOBIN: 13.5 g/dL (ref 12.0–15.0)
MCHC: 34.4 g/dL (ref 30.0–36.0)
MCV: 99.5 fl (ref 78.0–100.0)
Platelets: 255 10*3/uL (ref 150.0–400.0)
RBC: 3.95 Mil/uL (ref 3.87–5.11)
RDW: 12.8 % (ref 11.5–15.5)
WBC: 8.4 10*3/uL (ref 4.0–10.5)

## 2017-12-17 LAB — LIPID PANEL
CHOLESTEROL: 186 mg/dL (ref 0–200)
HDL: 61.5 mg/dL (ref >39.00)
LDL Cholesterol: 112 mg/dL — ABNORMAL HIGH (ref 0–99)
NONHDL: 124.98
Total CHOL/HDL Ratio: 3
Triglycerides: 65 mg/dL (ref 0.0–149.0)
VLDL: 13 mg/dL (ref 0.0–40.0)

## 2017-12-17 LAB — TSH: TSH: 2.74 u[IU]/mL (ref 0.35–4.50)

## 2017-12-18 DIAGNOSIS — G8929 Other chronic pain: Secondary | ICD-10-CM | POA: Insufficient documentation

## 2017-12-18 DIAGNOSIS — K5900 Constipation, unspecified: Secondary | ICD-10-CM | POA: Insufficient documentation

## 2017-12-18 DIAGNOSIS — R51 Headache: Secondary | ICD-10-CM

## 2017-12-18 DIAGNOSIS — K219 Gastro-esophageal reflux disease without esophagitis: Secondary | ICD-10-CM | POA: Insufficient documentation

## 2017-12-18 NOTE — Assessment & Plan Note (Signed)
Encouraged increased hydration, 64 ounces of clear fluids daily. Minimize alcohol and caffeine. Eat small frequent meals with lean proteins and complex carbs. Avoid high and low blood sugars. Get adequate sleep, 7-8 hours a night. Needs exercise daily preferably in the morning.  

## 2017-12-18 NOTE — Assessment & Plan Note (Signed)
Avoid offending foods, start probiotics. Do not eat large meals in late evening and consider raising head of bed.  

## 2017-12-18 NOTE — Assessment & Plan Note (Signed)
Encouraged increased hydration and fiber in diet. Daily probiotics. If bowels not moving can use MOM 2 tbls po in 4 oz of warm prune juice by mouth every 2-3 days. If no results then repeat in 4 hours with  Dulcolax suppository pr, may repeat again in 4 more hours as needed. Seek care if symptoms worsen. Consider daily Miralax and/or Dulcolax if symptoms persist. Has used Linzess in past with good results. Is given a refill today

## 2017-12-18 NOTE — Assessment & Plan Note (Signed)
Doing well on Fluoxetine no changes.  

## 2017-12-23 ENCOUNTER — Encounter: Payer: Self-pay | Admitting: Neurology

## 2017-12-23 ENCOUNTER — Telehealth: Payer: Self-pay | Admitting: *Deleted

## 2017-12-23 DIAGNOSIS — G35 Multiple sclerosis: Secondary | ICD-10-CM

## 2017-12-23 MED ORDER — AMPHETAMINE-DEXTROAMPHETAMINE 10 MG PO TABS: ORAL_TABLET | ORAL | 0 refills | Status: DC

## 2017-12-23 NOTE — Telephone Encounter (Signed)
Adderall up front [facility]. MRI brain ordered/fim

## 2017-12-27 MED FILL — AMPHETAMINE SALTS 10 MG TAB: 10 | 45 days supply | Qty: 90 | Fill #0

## 2018-01-06 ENCOUNTER — Telehealth: Payer: Self-pay | Admitting: Family

## 2018-01-06 DIAGNOSIS — J028 Acute pharyngitis due to other specified organisms: Principal | ICD-10-CM

## 2018-01-06 DIAGNOSIS — B9689 Other specified bacterial agents as the cause of diseases classified elsewhere: Secondary | ICD-10-CM

## 2018-01-06 MED ORDER — BENZONATATE 100 MG PO CAPS: 100.0000 mg | ORAL_CAPSULE | Freq: Two times a day (BID) | ORAL | 0 refills | Status: DC | PRN

## 2018-01-06 MED ORDER — PREDNISONE 5 MG PO TABS: 5.0000 mg | ORAL_TABLET | ORAL | 0 refills | Status: DC

## 2018-01-06 MED ORDER — AZITHROMYCIN 250 MG PO TABS: ORAL_TABLET | ORAL | 0 refills | Status: DC

## 2018-01-06 MED FILL — predniSONE 5 MG TABS: 5 | 6 days supply | Qty: 21 | Fill #0

## 2018-01-06 MED FILL — BENZONATATE 100 MG CAPS: 100 | 5 days supply | Qty: 20 | Fill #0

## 2018-01-06 MED FILL — AZITHROMYCIN 250 MG TABLET: 250 | 5 days supply | Qty: 6 | Fill #0

## 2018-01-06 NOTE — Progress Notes (Signed)
Thank you for the details you included in the comment boxes. Those details are very helpful in determining the best course of treatment for you and help [place] to provide the best care. See treatment plan below. As far as recommendations about being contagious, there are clear guidelines on flu, but it is at your discretion for other infections like this one. Typically, we recommend a return to work 24 hours after a fever breaks but that is when the fever is over 101.4. In this case, many people use a mask, but if you are feeling like you want to go home and rest, I am happy to write you a work note for a couple days.  We are sorry that you are not feeling well.  Here is how we plan to help!  Based on your presentation I believe you most likely have A cough due to bacteria.  When patients have a fever and a productive cough with a change in color or increased sputum production, we are concerned about bacterial bronchitis.  If left untreated it can progress to pneumonia.  If your symptoms do not improve with your treatment plan it is important that you contact your provider.   I have prescribed Azithromyin 250 mg: two tablets now and then one tablet daily for 4 additonal days    In addition you may use A non-prescription cough medication called Mucinex DM: take 2 tablets every 12 hours. and A prescription cough medication called Tessalon Perles 100mg . You may take 1-2 capsules every 8 hours as needed for your cough.  Sterapred 5 mg dosepak  From your responses in the eVisit questionnaire you describe inflammation in the upper respiratory tract which is causing a significant cough.  This is commonly called Bronchitis and has four common causes:    Allergies  Viral Infections  Acid Reflux  Bacterial Infection Allergies, viruses and acid reflux are treated by controlling symptoms or eliminating the cause. An example might be a cough caused by taking certain blood pressure medications. You stop the cough  by changing the medication. Another example might be a cough caused by acid reflux. Controlling the reflux helps control the cough.  USE OF BRONCHODILATOR ("RESCUE") INHALERS: There is a risk from using your bronchodilator too frequently.  The risk is that over-reliance on a medication which only relaxes the muscles surrounding the breathing tubes can reduce the effectiveness of medications prescribed to reduce swelling and congestion of the tubes themselves.  Although you feel brief relief from the bronchodilator inhaler, your asthma may actually be worsening with the tubes becoming more swollen and filled with mucus.  This can delay other crucial treatments, such as oral steroid medications. If you need to use a bronchodilator inhaler daily, several times per day, you should discuss this with your provider.  There are probably better treatments that could be used to keep your asthma under control.     HOME CARE . Only take medications as instructed by your medical team. . Complete the entire course of an antibiotic. . Drink plenty of fluids and get plenty of rest. . Avoid close contacts especially the very young and the elderly . Cover your mouth if you cough or cough into your sleeve. . Always remember to wash your hands . A steam or ultrasonic humidifier can help congestion.   GET HELP RIGHT AWAY IF: . You develop worsening fever. . You become short of breath . You cough up blood. . Your symptoms persist after you have completed  your treatment plan MAKE SURE YOU   Understand these instructions.  Will watch your condition.  Will get help right away if you are not doing well or get worse.  Your e-visit answers were reviewed by a board certified advanced clinical practitioner to complete your personal care plan.  Depending on the condition, your plan could have included both over the counter or prescription medications. If there is a problem please reply  once you have received a  response from your provider. Your safety is important to [place].  If you have drug allergies check your prescription carefully.    You can use MyChart to ask questions about today's visit, request a non-urgent call back, or ask for a work or school excuse for 24 hours related to this e-Visit. If it has been greater than 24 hours you will need to follow up with your provider, or enter a new e-Visit to address those concerns. You will get an e-mail in the next two days asking about your experience.  I hope that your e-visit has been valuable and will speed your recovery. Thank you for using e-visits.

## 2018-01-17 DIAGNOSIS — D170 Benign lipomatous neoplasm of skin and subcutaneous tissue of head, face and neck: Secondary | ICD-10-CM | POA: Diagnosis not present

## 2018-01-17 DIAGNOSIS — D485 Neoplasm of uncertain behavior of skin: Secondary | ICD-10-CM | POA: Diagnosis not present

## 2018-01-25 ENCOUNTER — Ambulatory Visit
Admission: RE | Admit: 2018-01-25 | Discharge: 2018-01-25 | Disposition: A | Discharge status: Home / Self Care | Payer: 59 | Source: Ambulatory Visit | Attending: Neurology | Admitting: Neurology

## 2018-01-25 DIAGNOSIS — G35 Multiple sclerosis: Secondary | ICD-10-CM | POA: Diagnosis not present

## 2018-01-25 MED ORDER — GADOBENATE DIMEGLUMINE 529 MG/ML IV SOLN
14.0000 mL | Freq: Once | INTRAVENOUS | Status: AC | PRN
  Administered 2018-01-25: 14 mL via INTRAVENOUS

## 2018-01-26 ENCOUNTER — Encounter: Payer: Self-pay | Admitting: Neurology

## 2018-01-31 ENCOUNTER — Other Ambulatory Visit: Payer: Self-pay | Admitting: Neurology

## 2018-01-31 ENCOUNTER — Encounter: Payer: Self-pay | Admitting: Neurology

## 2018-01-31 MED ORDER — AMPHETAMINE-DEXTROAMPHETAMINE 10 MG PO TABS: ORAL_TABLET | ORAL | 0 refills | Status: DC

## 2018-02-03 ENCOUNTER — Other Ambulatory Visit: Payer: Self-pay | Admitting: Neurology

## 2018-02-03 ENCOUNTER — Telehealth: Payer: Self-pay | Admitting: Neurology

## 2018-02-03 MED ORDER — AMPHETAMINE-DEXTROAMPHETAMINE 10 MG PO TABS: ORAL_TABLET | ORAL | 0 refills | Status: DC

## 2018-02-03 NOTE — Telephone Encounter (Signed)
Will ask RAS to send rx. to [facility]/fim

## 2018-02-03 NOTE — Telephone Encounter (Signed)
Laurie Cole/[facility] [phone] needs amphetamine-dextroamphetamine (ADDERALL) 10 MG tablet sent to them, pt is [name]. [facility] has filled the rx but the pt will not pick it up.

## 2018-02-04 ENCOUNTER — Telehealth: Payer: Self-pay | Admitting: *Deleted

## 2018-02-04 MED FILL — AMPHETAMINE-DEXTROAMPHETAMI: 10 | 45 days supply | Qty: 90 | Fill #0

## 2018-02-04 NOTE — Telephone Encounter (Signed)
PA for generic Adderall 10mg  tablets #90/30 completed via Cover My Meds.  Dx: ADHD (F90.9) and MS related fatigue (R53.83).  Tried and failed meds: generic Adderall 5mg , generic Adderall 10mg  bid, generic Ritalin 5mg , generic Ritalin 10mg Laurie Cole

## 2018-02-05 DIAGNOSIS — D170 Benign lipomatous neoplasm of skin and subcutaneous tissue of head, face and neck: Secondary | ICD-10-CM | POA: Diagnosis not present

## 2018-02-05 MED FILL — MUPIROCIN 2% OINTMENT: 2 | 7 days supply | Qty: 22 | Fill #0

## 2018-02-07 NOTE — Telephone Encounter (Signed)
Fax received from [place]. Adderall PA approved for dates 02/04/18 thru 02/04/19.  PA Ref # [id number].  Plan Name: [facility]. Plan Code [id number]

## 2018-02-13 ENCOUNTER — Encounter: Payer: Self-pay | Admitting: Family Medicine

## 2018-02-20 ENCOUNTER — Encounter: Payer: Self-pay | Admitting: Neurology

## 2018-02-21 ENCOUNTER — Other Ambulatory Visit: Payer: Self-pay | Admitting: *Deleted

## 2018-02-21 ENCOUNTER — Encounter: Payer: Self-pay | Admitting: Neurology

## 2018-02-21 MED ORDER — FLUOXETINE HCL 20 MG PO TABS: ORAL_TABLET | ORAL | 3 refills | Status: DC

## 2018-02-21 MED ORDER — MECLIZINE HCL 25 MG PO TABS: 25.0000 mg | ORAL_TABLET | Freq: Three times a day (TID) | ORAL | 0 refills | Status: DC | PRN

## 2018-02-21 MED FILL — FLUoxetine HCL 20 MG TABS: 20 | 90 days supply | Qty: 90 | Fill #0

## 2018-02-21 MED FILL — MECLIZINE 25 MG TABLET: 25 | 30 days supply | Qty: 90 | Fill #0

## 2018-03-17 ENCOUNTER — Encounter: Payer: Self-pay | Admitting: Neurology

## 2018-03-17 MED ORDER — AMPHETAMINE-DEXTROAMPHETAMINE 10 MG PO TABS
ORAL_TABLET | ORAL | 0 refills | Status: DC
Start: 2018-03-17 — End: 2018-04-17

## 2018-03-17 MED FILL — DEXTROAMP-AMP 10 MG TAB: 10 | 30 days supply | Qty: 90 | Fill #0

## 2018-04-07 MED FILL — LINZESS 72 MCG CAPSULE: 72 | 30 days supply | Qty: 30 | Fill #1

## 2018-04-14 ENCOUNTER — Encounter: Payer: Self-pay | Admitting: Neurology

## 2018-04-14 ENCOUNTER — Other Ambulatory Visit: Payer: Self-pay | Admitting: *Deleted

## 2018-04-14 DIAGNOSIS — Z79899 Other long term (current) drug therapy: Secondary | ICD-10-CM

## 2018-04-14 DIAGNOSIS — G35 Multiple sclerosis: Secondary | ICD-10-CM

## 2018-04-16 ENCOUNTER — Ambulatory Visit: Payer: 59 | Admitting: Neurology

## 2018-04-16 ENCOUNTER — Other Ambulatory Visit (INDEPENDENT_AMBULATORY_CARE_PROVIDER_SITE_OTHER): Payer: Self-pay

## 2018-04-16 DIAGNOSIS — Z79899 Other long term (current) drug therapy: Secondary | ICD-10-CM

## 2018-04-16 DIAGNOSIS — G35 Multiple sclerosis: Secondary | ICD-10-CM | POA: Diagnosis not present

## 2018-04-16 DIAGNOSIS — Z0289 Encounter for other administrative examinations: Secondary | ICD-10-CM

## 2018-04-17 ENCOUNTER — Encounter: Payer: Self-pay | Admitting: Neurology

## 2018-04-17 ENCOUNTER — Ambulatory Visit (INDEPENDENT_AMBULATORY_CARE_PROVIDER_SITE_OTHER): Payer: 59 | Admitting: Neurology

## 2018-04-17 VITALS — BP 114/76 | HR 85 | Ht 62.5 in | Wt 154.6 lb

## 2018-04-17 DIAGNOSIS — F329 Major depressive disorder, single episode, unspecified: Secondary | ICD-10-CM | POA: Diagnosis not present

## 2018-04-17 DIAGNOSIS — Z79899 Other long term (current) drug therapy: Secondary | ICD-10-CM | POA: Diagnosis not present

## 2018-04-17 DIAGNOSIS — G35 Multiple sclerosis: Secondary | ICD-10-CM | POA: Diagnosis not present

## 2018-04-17 DIAGNOSIS — R5383 Other fatigue: Secondary | ICD-10-CM

## 2018-04-17 DIAGNOSIS — F32A Depression, unspecified: Secondary | ICD-10-CM

## 2018-04-17 LAB — T-HELPER CELLS CD4/CD8 %
% CD 4 Pos. Lymph.: 57.8 % (ref 30.8–58.5)
Absolute CD 4 Helper: 694 /uL (ref 359–1519)
BASOS: 1 %
Basophils Absolute: 0 10*3/uL (ref 0.0–0.2)
CD3+CD4+ CELLS/CD3+CD8+ CELLS BLD: 4.22 — AB (ref 0.92–3.72)
CD3+CD8+ Cells # Bld: 164 /uL (ref 109–897)
CD3+CD8+ Cells NFr Bld: 13.7 % (ref 12.0–35.5)
EOS (ABSOLUTE): 0.1 10*3/uL (ref 0.0–0.4)
Eos: 3 %
HEMATOCRIT: 40.2 % (ref 34.0–46.6)
HEMOGLOBIN: 13.7 g/dL (ref 11.1–15.9)
IMMATURE GRANS (ABS): 0 10*3/uL (ref 0.0–0.1)
IMMATURE GRANULOCYTES: 0 %
LYMPHS ABS: 1.2 10*3/uL (ref 0.7–3.1)
LYMPHS: 29 %
MCH: 33.5 pg — AB (ref 26.6–33.0)
MCHC: 34.1 g/dL (ref 31.5–35.7)
MCV: 98 fL — ABNORMAL HIGH (ref 79–97)
Monocytes Absolute: 0.3 10*3/uL (ref 0.1–0.9)
Monocytes: 7 %
NEUTROS PCT: 60 %
Neutrophils Absolute: 2.4 10*3/uL (ref 1.4–7.0)
Platelets: 250 10*3/uL (ref 150–379)
RBC: 4.09 x10E6/uL (ref 3.77–5.28)
RDW: 14.6 % (ref 12.3–15.4)
WBC: 4 10*3/uL (ref 3.4–10.8)

## 2018-04-17 LAB — COMPREHENSIVE METABOLIC PANEL
ALT: 20 IU/L (ref 0–32)
AST: 26 IU/L (ref 0–40)
Albumin/Globulin Ratio: 2 (ref 1.2–2.2)
Albumin: 4.6 g/dL (ref 3.5–5.5)
Alkaline Phosphatase: 52 IU/L (ref 39–117)
BUN/Creatinine Ratio: 18 (ref 9–23)
BUN: 12 mg/dL (ref 6–20)
Bilirubin Total: 0.7 mg/dL (ref 0.0–1.2)
CO2: 24 mmol/L (ref 20–29)
CREATININE: 0.68 mg/dL (ref 0.57–1.00)
Calcium: 9.6 mg/dL (ref 8.7–10.2)
Chloride: 105 mmol/L (ref 96–106)
GFR, EST AFRICAN AMERICAN: 127 mL/min/{1.73_m2} (ref >59)
GFR, EST NON AFRICAN AMERICAN: 111 mL/min/{1.73_m2} (ref >59)
GLUCOSE: 93 mg/dL (ref 65–99)
Globulin, Total: 2.3 g/dL (ref 1.5–4.5)
Potassium: 4 mmol/L (ref 3.5–5.2)
Sodium: 143 mmol/L (ref 134–144)
TOTAL PROTEIN: 6.9 g/dL (ref 6.0–8.5)

## 2018-04-17 LAB — TSH: TSH: 2.35 u[IU]/mL (ref 0.450–4.500)

## 2018-04-17 LAB — HEPATIC FUNCTION PANEL: Bilirubin, Direct: 0.19 mg/dL (ref 0.00–0.40)

## 2018-04-17 MED ORDER — AMPHETAMINE-DEXTROAMPHETAMINE 10 MG PO TABS: ORAL_TABLET | ORAL | 0 refills | Status: DC

## 2018-04-17 NOTE — Progress Notes (Signed)
[facility]  PATIENT: Laurie Cole DOB: 1976/05/26  REFERRING CLINICIAN: Loyal Gambler HISTORY FROM: Patient REASON FOR VISIT: MS   HISTORICAL  CHIEF COMPLAINT:  Chief Complaint  Patient presents with  . Follow-up  . Multiple Sclerosis    RM 12, Doing well. Still has flushing with tecfidera.      HISTORY OF PRESENT ILLNESS:  Laurie Cole is a 42 year old woman with MS diagnosed in 2006.     Update 04/17/2018: She is on Tecfidera and tolerates it well most days.  Some days, she still will have some flushing even when she takes after a meal..    Her last MRI was performed 01/25/2018 and showed no new lesions.  Images were personally reviewed.  She feels her gait is good but may drag the left leg a little bit if she walks rapidly before she is warmed up.     She remains active and exercises regularly.   She has lost a couple pounds.     Vision is normal.    She has had occasional stress incontinence with sneeze/laugh but not otherwise.     Her fatigue is doing well.    She is sleeping well.    Adderall has helped her fatigue and her mild attentional issues.  Mood is doing well.  She notes occasional fasciculations but these have not worsened any..   She is on Lumify for her red eye.     Update 10/17/2017:   She is on Tecfidera for her MS. She tolerates it well. She has not had any exacerbations. He now and then she will have some flushing. Lymphocytes are checked on a regular basis and they have been in the normal range. The MRI of the brain 12/29/2016 showed white matter foci consistent with her diagnosis of MS but there were no changes when compared to the previous MRI. She denies any difficulty with her gait. There is no numbness, weakness or change in her vision. Bladder is fine. Her fatigue is doing fairly well. Adderall helps. She is concerned about some recent weight gain and is going to try to eat better and exercise more. Of note, she eats more in the  morning than she normally would as it reduces the flushing with the Tecfidera. She has tried some days moving her first dose of Tecfidera to after lunch and does better. Mood and cognition are doing well.                                                           From 01/03/2017:   MS:  For MS is stable. She has no new exacerbations. She has been on Tecfidera since 2013 and she tolerates it reasonably well but continues to have some flushing    Lymphocytes have been checked every 6 months and have been in the normal range.   Flushing is better with moving the timing of her pills and taking aspirin.      Her MRI of the brain 12/29/2016 with and without contrast shows T2/FLAIR hyperintense foci in a pattern and configuration consistent with chronic demyelinating plaques of MS. There is no interval change (compared to 12/2015) and there are no acute findings on the current MRI.         Muscle fasciculations:  She has occasional  muscle fasciculations, worse when tired.  Her EMG/NCV was normal.      She denies any significant issues with gait, numbness, weakness or vision. She did have numbness in the past at the time of diagnosis.  She has no bladder urgency or frequency.  Fatigue/sleep:  Her fatigue is doing better since she is exercising more     She has some heat intolerance with more fatigue. As needed, she takes Adderall 10 mg by mouth twice a day with benefit. She denies any side effects.  She sleeps well most nights.  Mood:   She has been on Prozac 20 mg and tolerates it well.   She feels mood is doing very well.    She denies any significant cognitive issues.    Vit D:   She is on vit D supplements of a mild deficiency.  MS History:  who presented with a L[place] sign, visual changes and numbness in 2006. MRIs were performed that were consistent with multiple sclerosis. Additionally, visual evoked potentials showed slowing of the P100. She was placed on Betaseron. She stopped when she was pregnant  about 2 years later. She returned to Betaseron for about another year and one half after her son was born. Then she had some breakthrough relapses and she was switched to Tysabri and tolerated it very well. Unfortunately, after a couple years she converted from negative to positive at a high titer.   She does not have any exacerbations while on Tysabri. She then switched to Tecfidera. She's been on Tecfidera about 3 years now and has tolerated it well. She has not had any exacerbations while on it. MRIs have been stable.    REVIEW OF SYSTEMS:  Constitutional: No fevers, chills, sweats, or change in appetite.  Notes  Fatigue Eyes: No visual changes, double vision, eye pain Ear, nose and throat: No hearing loss, ear pain, nasal congestion, sore throat Cardiovascular: No chest pain, palpitations Respiratory:  No shortness of breath at rest or with exertion.   No wheezes GastrointestinaI: No nausea, vomiting, diarrhea, abdominal pain, fecal incontinence Genitourinary:  No dysuria, urinary retention or frequency.  No nocturia. Musculoskeletal:  No neck pain, back pain.   Reports a muscle spasm sensation in the right hip. Integumentary: No rash, pruritus, skin lesions Neurological: as above Psychiatric: Notes  depression at this time.  No anxiety Endocrine: No palpitations, diaphoresis, change in appetite, change in weigh or increased thirst Hematologic/Lymphatic:  No anemia, purpura, petechiae. Allergic/Immunologic: No itchy/runny eyes, nasal congestion, recent allergic reactions, rashes  ALLERGIES: No Known Allergies  HOME MEDICATIONS:  Current Outpatient Medications:  .  amphetamine-dextroamphetamine (ADDERALL) 10 MG tablet, Take one tablet 3 times daily as needed., Disp: 90 tablet, Rfl: 0 .  Biotin 10000 MCG TABS, Take by mouth., Disp: , Rfl:  .  cholecalciferol (VITAMIN D) 1000 units tablet, Take 5,000 Units by mouth daily., Disp: , Rfl:  .  Dimethyl Fumarate (TECFIDERA) 240 MG CPDR, TAKE  1 CAPSULE BY MOUTH TWICE DAILY -STORE AT ROOM TEMPERATURE IN ORIGINAL CONTAINER. DO NOT CRUSH OR CHEW. SWALLOW WHOLE, Disp: 180 capsule, Rfl: 3 .  FLUoxetine (PROZAC) 20 MG tablet, Take 1/2 to 1 TABLET daily, Disp: 90 tablet, Rfl: 3 .  linaclotide (LINZESS) 72 MCG capsule, Take 1 capsule (72 mcg total) by mouth daily before breakfast., Disp: 30 capsule, Rfl: 2 .  meclizine (ANTIVERT) 25 MG tablet, Take 1 tablet (25 mg total) by mouth 3 (three) times daily as needed for dizziness., Disp: 90 tablet, Rfl:  0   PAST MEDICAL HISTORY: Past Medical History:  Diagnosis Date  . Depression   . MS (multiple sclerosis) ([place])    sees Dr Kerman Passey in [facility]    PAST SURGICAL HISTORY: Past Surgical History:  Procedure Laterality Date  . LIPOSUCTION      FAMILY HISTORY: Family History  Problem Relation Age of Onset  . Drug abuse Mother        opiod addiction  . Arthritis Mother        back pain  . Arthritis Father   . Arthritis/Rheumatoid Father   . Ankylosing spondylitis Father   . Seizures Maternal Aunt   . Parkinsonism Maternal Grandfather   . Diabetes Maternal Grandfather   . Lupus Maternal Aunt   . Mental illness Brother        bipolar disorder  . Parkinson's disease Paternal Grandmother   . Arthritis Sister   . Mental illness Sister   . Drug abuse Sister     SOCIAL HISTORY:  Social History   Socioeconomic History  . Marital status: Married    Spouse name: Jeneen Rinks  . Number of children: 1  . Years of education: 67  . Highest education level: Not on file  Occupational History  . Occupation: Education officer, museum  Social Needs  . Financial resource strain: Not on file  . Food insecurity:    Worry: Not on file    Inability: Not on file  . Transportation needs:    Medical: Not on file    Non-medical: Not on file  Tobacco Use  . Smoking status: Never Smoker  . Smokeless tobacco: Never Used  Substance and Sexual Activity  . Alcohol use: Yes    Alcohol/week: 0.5 oz    Types: 1 Standard  drinks or equivalent per week  . Drug use: No  . Sexual activity: Yes    Partners: Male    Birth control/protection: Surgical  Lifestyle  . Physical activity:    Days per week: Not on file    Minutes per session: Not on file  . Stress: Not on file  Relationships  . Social connections:    Talks on phone: Not on file    Gets together: Not on file    Attends religious service: Not on file    Active member of club or organization: Not on file    Attends meetings of clubs or organizations: Not on file    Relationship status: Not on file  . Intimate partner violence:    Fear of current or ex partner: Not on file    Emotionally abused: Not on file    Physically abused: Not on file    Forced sexual activity: Not on file  Other Topics Concern  . Not on file  Social History Narrative   Lives with husband and son, works as Education officer, museum, no dietary restrictions, exercises intermittently. Wears a seat belt regularly     PHYSICAL EXAM  Vitals:   04/17/18 0859  BP: 114/76  Pulse: 85  Weight: 154 lb 9.6 oz (70.1 kg)  Height: 5' 2.5" (1.588 m)    Body mass index is 27.83 kg/m.   General: The patient is well-developed and well-nourished and in no acute distress  Neurologic Exam  Mental status: The patient is alert and oriented x 3 at the time of the examination. The patient has apparent normal recent and remote memory, with an apparently normal attention span and concentration ability.   Speech is normal.  Cranial nerves: Extraocular  movements are full.   Facial symmetry is present. There is good facial sensation to soft touch bilaterally.Facial strength is normal.    No dysarthria is noted.    No obvious hearing deficits are noted.  Motor:  There are no fasciculations. Muscle bulk and muscle tone are normal. Strength is 5/5. \  Sensory: She has intact sensation in the arms and legs.  Coordination: Cerebellar testing reveals good finger-nose-finger and heel-to-shin  bilaterally.  Gait and station: Station and gait are normal.  The tandem gait is normal.  Romberg is negative.  Reflexes: Deep tendon reflexes are mildly increased in the legs.. No ankle clonus.    DIAGNOSTIC DATA (LABS, IMAGING, TESTING) - I reviewed patient records, labs, notes, testing and imaging myself where available.     ASSESSMENT AND PLAN  MS (multiple sclerosis) (HCC)  Other fatigue  Depression, unspecified depression type  High risk medication use    1.  Continue Tecfidera.  Lab work was fine.  CD4/CD8 count is fine. 2.   Continue Prozac and Adderall.   A refill for Adderall was provided.    3.   rtc 6 months or sooner if problems   Markice Torbert A. Felecia Shelling, MD, PhD 4/49/2010, 0:71 PM Certified in Neurology, Clinical Neurophysiology, Sleep Medicine, Pain Medicine and Neuroimaging  [facility] [address] [place], [place] [address] [phone]

## 2018-04-25 MED FILL — DEXTROAMP-AMP 10 MG TAB: 10 | 30 days supply | Qty: 90 | Fill #0

## 2018-06-16 ENCOUNTER — Encounter: Payer: Self-pay | Admitting: Neurology

## 2018-06-16 MED ORDER — AMPHETAMINE-DEXTROAMPHETAMINE 10 MG PO TABS: ORAL_TABLET | ORAL | 0 refills | Status: DC

## 2018-06-16 MED FILL — DEXTROAMP-AMP 10 MG TAB: 10 | 30 days supply | Qty: 90 | Fill #0

## 2018-06-16 MED FILL — LINZESS 72 MCG CAPSULE: 72 | 30 days supply | Qty: 30 | Fill #2

## 2018-06-18 ENCOUNTER — Encounter: Payer: Self-pay | Admitting: Neurology

## 2018-06-18 ENCOUNTER — Other Ambulatory Visit: Payer: Self-pay

## 2018-06-18 ENCOUNTER — Ambulatory Visit (INDEPENDENT_AMBULATORY_CARE_PROVIDER_SITE_OTHER): Payer: 59 | Admitting: Neurology

## 2018-06-18 VITALS — BP 134/76 | HR 98 | Resp 14 | Ht 62.5 in | Wt 154.0 lb

## 2018-06-18 DIAGNOSIS — Z79899 Other long term (current) drug therapy: Secondary | ICD-10-CM

## 2018-06-18 DIAGNOSIS — R5383 Other fatigue: Secondary | ICD-10-CM

## 2018-06-18 DIAGNOSIS — R200 Anesthesia of skin: Secondary | ICD-10-CM | POA: Diagnosis not present

## 2018-06-18 DIAGNOSIS — F419 Anxiety disorder, unspecified: Secondary | ICD-10-CM | POA: Diagnosis not present

## 2018-06-18 DIAGNOSIS — G35 Multiple sclerosis: Secondary | ICD-10-CM | POA: Diagnosis not present

## 2018-06-18 NOTE — Progress Notes (Signed)
[facility]  PATIENT: Laurie Cole DOB: 1976/11/22  REFERRING CLINICIAN: Loyal Gambler HISTORY FROM: Patient REASON FOR VISIT: MS   HISTORICAL  CHIEF COMPLAINT:  Chief Complaint  Patient presents with  . Multiple Sclerosis    Sts. she continues to tolerate Tecfidera well.  Here today with new c/o facial, bilat arm and leg numbness/tingling, increased fatigue./fim    HISTORY OF PRESENT ILLNESS:  Laurie Cole is a 42 year old woman with MS diagnosed in 2006.     Update 06/18/2018: She is noting more numbness, in the face and both arms and legs.    Symptoms started on Friday and are mostly mild.    She notes that the symptoms are symmetric and in her face more than in the arms.    She did have a headache the first day and spent much of the weekend outdoors.    She actually felt better over the weekend but the numbness returned today.     MRI of the brain 01/26/2018 showed a stable pattern of white matter foci in the cerbral hemispheres with one right cerebellar focus, c/w MS.   There were no new lesions compared to 12/30/2017     She is on Tecfidera and she tolerates it well.  She is noting more anxiety and has had more stress at work.  She is noting more fatigue,  Worse later in the day than in the morning.   She has some heat sensitivity.     She is sleeping well.   Adderall has helped her fatigue.      Update 04/17/2018: She is on Tecfidera and tolerates it well most days.  Some days, she still will have some flushing even when she takes after a meal..    Her last MRI was performed 01/25/2018 and showed no new lesions.  Images were personally reviewed.  She feels her gait is good but may drag the left leg a little bit if she walks rapidly before she is warmed up.     She remains active and exercises regularly.   She has lost a couple pounds.     Vision is normal.    She has had occasional stress incontinence with sneeze/laugh but not otherwise.     Her fatigue is  doing well.    She is sleeping well.    Adderall has helped her fatigue and her mild attentional issues.  Mood is doing well.  She notes occasional fasciculations but these have not worsened any..   She is on Lumify for her red eye.     Update 10/17/2017:   She is on Tecfidera for her MS. She tolerates it well. She has not had any exacerbations. He now and then she will have some flushing. Lymphocytes are checked on a regular basis and they have been in the normal range. The MRI of the brain 12/29/2016 showed white matter foci consistent with her diagnosis of MS but there were no changes when compared to the previous MRI. She denies any difficulty with her gait. There is no numbness, weakness or change in her vision. Bladder is fine. Her fatigue is doing fairly well. Adderall helps. She is concerned about some recent weight gain and is going to try to eat better and exercise more. Of note, she eats more in the morning than she normally would as it reduces the flushing with the Tecfidera. She has tried some days moving her first dose of Tecfidera to after lunch and does better. Mood  and cognition are doing well.                                                           From 01/03/2017:   MS:  For MS is stable. She has no new exacerbations. She has been on Tecfidera since 2013 and she tolerates it reasonably well but continues to have some flushing    Lymphocytes have been checked every 6 months and have been in the normal range.   Flushing is better with moving the timing of her pills and taking aspirin.      Her MRI of the brain 12/29/2016 with and without contrast shows T2/FLAIR hyperintense foci in a pattern and configuration consistent with chronic demyelinating plaques of MS. There is no interval change (compared to 12/2015) and there are no acute findings on the current MRI.         Muscle fasciculations:  She has occasional muscle fasciculations, worse when tired.  Her EMG/NCV was normal.      She  denies any significant issues with gait, numbness, weakness or vision. She did have numbness in the past at the time of diagnosis.  She has no bladder urgency or frequency.  Fatigue/sleep:  Her fatigue is doing better since she is exercising more     She has some heat intolerance with more fatigue. As needed, she takes Adderall 10 mg by mouth twice a day with benefit. She denies any side effects.  She sleeps well most nights.  Mood:   She has been on Prozac 20 mg and tolerates it well.   She feels mood is doing very well.    She denies any significant cognitive issues.    Vit D:   She is on vit D supplements of a mild deficiency.  MS History:  who presented with a Lhermite sign, visual changes and numbness in 2006. MRIs were performed that were consistent with multiple sclerosis. Additionally, visual evoked potentials showed slowing of the P100. She was placed on Betaseron. She stopped when she was pregnant about 2 years later. She returned to Betaseron for about another year and one half after her son was born. Then she had some breakthrough relapses and she was switched to Tysabri and tolerated it very well. Unfortunately, after a couple years she converted from negative to positive at a high titer.   She does not have any exacerbations while on Tysabri. She then switched to Tecfidera. She's been on Tecfidera about 3 years now and has tolerated it well. She has not had any exacerbations while on it. MRIs have been stable.    REVIEW OF SYSTEMS:  Constitutional: No fevers, chills, sweats, or change in appetite.  Notes  Fatigue Eyes: No visual changes, double vision, eye pain Ear, nose and throat: No hearing loss, ear pain, nasal congestion, sore throat Cardiovascular: No chest pain, palpitations Respiratory:  No shortness of breath at rest or with exertion.   No wheezes GastrointestinaI: No nausea, vomiting, diarrhea, abdominal pain, fecal incontinence Genitourinary:  No dysuria, urinary retention  or frequency.  No nocturia. Musculoskeletal:  No neck pain, back pain.   Reports a muscle spasm sensation in the right hip. Integumentary: No rash, pruritus, skin lesions Neurological: as above Psychiatric: Notes  depression at this time.  No anxiety Endocrine: No palpitations,  diaphoresis, change in appetite, change in weigh or increased thirst Hematologic/Lymphatic:  No anemia, purpura, petechiae. Allergic/Immunologic: No itchy/runny eyes, nasal congestion, recent allergic reactions, rashes  ALLERGIES: No Known Allergies  HOME MEDICATIONS:  Current Outpatient Medications:  .  amphetamine-dextroamphetamine (ADDERALL) 10 MG tablet, Take one tablet 3 times daily as needed., Disp: 90 tablet, Rfl: 0 .  Biotin 10000 MCG TABS, Take by mouth., Disp: , Rfl:  .  cholecalciferol (VITAMIN D) 1000 units tablet, Take 5,000 Units by mouth daily., Disp: , Rfl:  .  Dimethyl Fumarate (TECFIDERA) 240 MG CPDR, TAKE 1 CAPSULE BY MOUTH TWICE DAILY -STORE AT ROOM TEMPERATURE IN ORIGINAL CONTAINER. DO NOT CRUSH OR CHEW. SWALLOW WHOLE, Disp: 180 capsule, Rfl: 3 .  FLUoxetine (PROZAC) 20 MG tablet, Take 1/2 to 1 TABLET daily, Disp: 90 tablet, Rfl: 3 .  linaclotide (LINZESS) 72 MCG capsule, Take 1 capsule (72 mcg total) by mouth daily before breakfast., Disp: 30 capsule, Rfl: 2 .  meclizine (ANTIVERT) 25 MG tablet, Take 1 tablet (25 mg total) by mouth 3 (three) times daily as needed for dizziness., Disp: 90 tablet, Rfl: 0   PAST MEDICAL HISTORY: Past Medical History:  Diagnosis Date  . Depression   . MS (multiple sclerosis) ([place])    sees Dr Kerman Passey in [facility]    PAST SURGICAL HISTORY: Past Surgical History:  Procedure Laterality Date  . LIPOSUCTION      FAMILY HISTORY: Family History  Problem Relation Age of Onset  . Drug abuse Mother        opiod addiction  . Arthritis Mother        back pain  . Arthritis Father   . Arthritis/Rheumatoid Father   . Ankylosing spondylitis Father   . Seizures Maternal  Aunt   . Parkinsonism Maternal Grandfather   . Diabetes Maternal Grandfather   . Lupus Maternal Aunt   . Mental illness Brother        bipolar disorder  . Parkinson's disease Paternal Grandmother   . Arthritis Sister   . Mental illness Sister   . Drug abuse Sister     SOCIAL HISTORY:  Social History   Socioeconomic History  . Marital status: Married    Spouse name: Jeneen Rinks  . Number of children: 1  . Years of education: 49  . Highest education level: Not on file  Occupational History  . Occupation: Education officer, museum  Social Needs  . Financial resource strain: Not on file  . Food insecurity:    Worry: Not on file    Inability: Not on file  . Transportation needs:    Medical: Not on file    Non-medical: Not on file  Tobacco Use  . Smoking status: Never Smoker  . Smokeless tobacco: Never Used  Substance and Sexual Activity  . Alcohol use: Yes    Alcohol/week: 0.6 oz    Types: 1 Standard drinks or equivalent per week  . Drug use: No  . Sexual activity: Yes    Partners: Male    Birth control/protection: Surgical  Lifestyle  . Physical activity:    Days per week: Not on file    Minutes per session: Not on file  . Stress: Not on file  Relationships  . Social connections:    Talks on phone: Not on file    Gets together: Not on file    Attends religious service: Not on file    Active member of club or organization: Not on file    Attends meetings of  clubs or organizations: Not on file    Relationship status: Not on file  . Intimate partner violence:    Fear of current or ex partner: Not on file    Emotionally abused: Not on file    Physically abused: Not on file    Forced sexual activity: Not on file  Other Topics Concern  . Not on file  Social History Narrative   Lives with husband and son, works as Education officer, museum, no dietary restrictions, exercises intermittently. Wears a seat belt regularly     PHYSICAL EXAM  Vitals:   06/18/18 1604  BP: 134/76  Pulse: 98    Resp: 14  Weight: 154 lb (69.9 kg)  Height: 5' 2.5" (1.588 m)    Body mass index is 27.72 kg/m.   General: The patient is well-developed and well-nourished and in no acute distress  Neurologic Exam  Mental status: The patient is alert and oriented x 3 at the time of the examination. The patient has apparent normal recent and remote memory, with an apparently normal attention span and concentration ability.   Speech is normal.  Cranial nerves: Extraocular movements are full.   Facial strength and sensation is normal.  Trapezius strength is normal.  No dysarthria is noted.    No obvious hearing deficits are noted.  Motor:  There are no fasciculations. Muscle bulk and muscle tone are normal. Strength is 5/5. \  Sensory: There is intact sensation to touch and vibration in the arms and legs.  Coordination: Cerebellar testing reveals good finger-nose-finger and heel-to-shin bilaterally.  Gait and station: Station and gait are normal.  The tandem gait is normal.  Romberg is negative.  Reflexes: Deep tendon reflexes are mildly increased in the legs.. No ankle clonus.    DIAGNOSTIC DATA (LABS, IMAGING, TESTING) - I reviewed patient records, labs, notes, testing and imaging myself where available.     ASSESSMENT AND PLAN  MS (multiple sclerosis) (HCC)  Numbness  Other fatigue  High risk medication use  Anxiety    1.  We discussed that given the distribution of her numbness, an exacerbation is unlikely.  The etiology is uncertain but it could be an element of hypervigilance from anxiety.  However, if symptoms worsen, the possibility of an exacerbation is higher and we may want to consider imaging. 2.   Continue Tecfidera and other medications.     3.   rtc as scheduled or sooner if problems.   If symptoms worsen she will let us know.   Arshia Rondon A. Felecia Shelling, MD, PhD 4/38/8875, 7:97 PM Certified in Neurology, Clinical Neurophysiology, Sleep Medicine, Pain Medicine and  Neuroimaging  [facility] [address] [place], [place] [address] [phone]

## 2018-07-17 ENCOUNTER — Other Ambulatory Visit: Payer: Self-pay | Admitting: Family Medicine

## 2018-07-17 MED ORDER — AMPHETAMINE-DEXTROAMPHETAMINE 10 MG PO TABS: ORAL_TABLET | ORAL | 0 refills | Status: DC

## 2018-07-17 MED FILL — FLUoxetine HCL 20 MG TABS: 20 | 90 days supply | Qty: 90 | Fill #1

## 2018-07-17 MED FILL — AMPHETAMINE-DEXTROAMPHETAMI: 10 | 30 days supply | Qty: 90 | Fill #0

## 2018-07-18 MED FILL — LINZESS 72 MCG CAPSULE: 72 | 30 days supply | Qty: 30 | Fill #0

## 2018-09-02 MED ORDER — AMPHETAMINE-DEXTROAMPHETAMINE 10 MG PO TABS: ORAL_TABLET | ORAL | 0 refills | Status: DC

## 2018-09-03 MED FILL — AMPHETAMINE-DEXTROAMPHETAMI: 10 | 30 days supply | Qty: 90 | Fill #0

## 2018-09-23 ENCOUNTER — Encounter: Payer: Self-pay | Admitting: Family Medicine

## 2018-10-14 ENCOUNTER — Other Ambulatory Visit: Payer: Self-pay | Admitting: *Deleted

## 2018-10-14 ENCOUNTER — Telehealth: Payer: Self-pay | Admitting: *Deleted

## 2018-10-14 MED ORDER — AMPHETAMINE-DEXTROAMPHETAMINE 10 MG PO TABS: ORAL_TABLET | ORAL | 0 refills | Status: DC

## 2018-10-14 NOTE — Telephone Encounter (Signed)
PA approved.  The authorization is effective for a maximum of 12 fills from 10/13/2018 to 10/13/2019, as long as the member is enrolled in their current health plan. The request was approved as submitted. This request is approved for 2 capsules per day. Please note: The plan requires this medication to be filled through a [place], call [phone]. A written notification letter will follow with additional details.  Faxed notice of approval to [employer] at [phone]. Received fax confirmation.

## 2018-10-14 NOTE — Telephone Encounter (Signed)
Submitted PA Tecfidera 240mg  on CMM. Key: [id number]. Waiting on determination.

## 2018-10-15 MED FILL — AMPHETAMINE-DEXTROAMPHETAMI: 10 | 30 days supply | Qty: 90 | Fill #0

## 2018-10-20 ENCOUNTER — Ambulatory Visit: Payer: 59 | Admitting: Neurology

## 2018-11-13 ENCOUNTER — Encounter: Payer: Self-pay | Admitting: *Deleted

## 2018-11-13 ENCOUNTER — Encounter: Payer: Self-pay | Admitting: Neurology

## 2018-11-13 ENCOUNTER — Other Ambulatory Visit: Payer: Self-pay

## 2018-11-13 ENCOUNTER — Telehealth: Payer: Self-pay | Admitting: Neurology

## 2018-11-13 ENCOUNTER — Ambulatory Visit (INDEPENDENT_AMBULATORY_CARE_PROVIDER_SITE_OTHER): Payer: 59 | Admitting: Neurology

## 2018-11-13 VITALS — BP 114/80 | HR 83 | Resp 16 | Ht 62.0 in | Wt 154.0 lb

## 2018-11-13 DIAGNOSIS — Z79899 Other long term (current) drug therapy: Secondary | ICD-10-CM

## 2018-11-13 DIAGNOSIS — R5383 Other fatigue: Secondary | ICD-10-CM | POA: Diagnosis not present

## 2018-11-13 DIAGNOSIS — F419 Anxiety disorder, unspecified: Secondary | ICD-10-CM

## 2018-11-13 DIAGNOSIS — R200 Anesthesia of skin: Secondary | ICD-10-CM | POA: Diagnosis not present

## 2018-11-13 DIAGNOSIS — G35 Multiple sclerosis: Secondary | ICD-10-CM

## 2018-11-13 MED ORDER — FLUOXETINE HCL 20 MG PO TABS: ORAL_TABLET | ORAL | 3 refills | Status: DC

## 2018-11-13 MED ORDER — LINACLOTIDE 72 MCG PO CAPS: ORAL_CAPSULE | ORAL | 4 refills | Status: DC

## 2018-11-13 MED ORDER — AMPHETAMINE-DEXTROAMPHETAMINE 10 MG PO TABS: ORAL_TABLET | ORAL | 0 refills | Status: DC

## 2018-11-13 MED FILL — AMPHETAMINE-DEXTROAMPHETAMI: 10 | 30 days supply | Qty: 90 | Fill #0

## 2018-11-13 MED FILL — LINZESS 72 MCG CAPSULE: 72 | 90 days supply | Qty: 90 | Fill #0

## 2018-11-13 MED FILL — FLUoxetine HCL 20 MG TABS: 20 | 90 days supply | Qty: 90 | Fill #0

## 2018-11-13 NOTE — Telephone Encounter (Signed)
I spoke to the patient and offered her to come to our office next Tues or Wed. She stated she cannot do either of those days. I told her that was our last week to do MRI until next year. She stated she would like to go to GI. I informed her I would send the order there and for her to go ahead to give them a call so she can get on the schedule before the year is up.

## 2018-11-13 NOTE — Progress Notes (Signed)
[facility]  PATIENT: Laurie Cole DOB: 1976/02/17  REFERRING CLINICIAN: Loyal Gambler HISTORY FROM: Patient REASON FOR VISIT: MS   HISTORICAL  CHIEF COMPLAINT:  Chief Complaint  Patient presents with  . Multiple Sclerosis    Rm. 13.  Sts. she continues to tolerate Tecfidera well./fim    HISTORY OF PRESENT ILLNESS:  Laurie Cole is a 42 y.o. woman with MS diagnosed in 2006.     Update 11/13/2018: She is doing well and has no recent exacerbation or new MS symptoms.  She still is having some side effects on Tecfidera, flushing greater than GI.  Her last MRI did not show any new MS lesions.  We will need to repeat another 1 to make sure there is not subclinical progression.  Her gait is fine and she has no trouble climbing stairs or doing other activities.  She denies any  weakness in the legs or any fixed numbness.  The numbness that she was experiencing earlier this year has resolved.  She notes some fatigue that is helped by Adderall.  She also has some issues with attention deficit helped by Adderall.  Cognition is otherwise fine.  She sleeps well most nights.   Mood is stable.  Depression is doing well.  She has some anxiety but it is generally doing well.  She has a long history of IBS-C and is on Linzess.     Update 06/18/2018: She is noting more numbness, in the face and both arms and legs.    Symptoms started on Friday and are mostly mild.    She notes that the symptoms are symmetric and in her face more than in the arms.    She did have a headache the first day and spent much of the weekend outdoors.    She actually felt better over the weekend but the numbness returned today.     MRI of the brain 01/26/2018 showed a stable pattern of white matter foci in the cerbral hemispheres with one right cerebellar focus, c/w MS.   There were no new lesions compared to 12/30/2017     She is on Tecfidera and she tolerates it well.  She is noting more anxiety and has  had more stress at work.  She is noting more fatigue,  Worse later in the day than in the morning.   She has some heat sensitivity.     She is sleeping well.   Adderall has helped her fatigue.      Update 04/17/2018: She is on Tecfidera and tolerates it well most days.  Some days, she still will have some flushing even when she takes after a meal..    Her last MRI was performed 01/25/2018 and showed no new lesions.  Images were personally reviewed.  She feels her gait is good but may drag the left leg a little bit if she walks rapidly before she is warmed up.     She remains active and exercises regularly.   She has lost a couple pounds.     Vision is normal.    She has had occasional stress incontinence with sneeze/laugh but not otherwise.     Her fatigue is doing well.    She is sleeping well.    Adderall has helped her fatigue and her mild attentional issues.  Mood is doing well.  She notes occasional fasciculations but these have not worsened any..   She is on Lumify for her red eye.  Update 10/17/2017:   She is on Tecfidera for her MS. She tolerates it well. She has not had any exacerbations. He now and then she will have some flushing. Lymphocytes are checked on a regular basis and they have been in the normal range. The MRI of the brain 12/29/2016 showed white matter foci consistent with her diagnosis of MS but there were no changes when compared to the previous MRI. She denies any difficulty with her gait. There is no numbness, weakness or change in her vision. Bladder is fine. Her fatigue is doing fairly well. Adderall helps. She is concerned about some recent weight gain and is going to try to eat better and exercise more. Of note, she eats more in the morning than she normally would as it reduces the flushing with the Tecfidera. She has tried some days moving her first dose of Tecfidera to after lunch and does better. Mood and cognition are doing well.                                                            From 01/03/2017:   MS:  For MS is stable. She has no new exacerbations. She has been on Tecfidera since 2013 and she tolerates it reasonably well but continues to have some flushing    Lymphocytes have been checked every 6 months and have been in the normal range.   Flushing is better with moving the timing of her pills and taking aspirin.      Her MRI of the brain 12/29/2016 with and without contrast shows T2/FLAIR hyperintense foci in a pattern and configuration consistent with chronic demyelinating plaques of MS. There is no interval change (compared to 12/2015) and there are no acute findings on the current MRI.         Muscle fasciculations:  She has occasional muscle fasciculations, worse when tired.  Her EMG/NCV was normal.      She denies any significant issues with gait, numbness, weakness or vision. She did have numbness in the past at the time of diagnosis.  She has no bladder urgency or frequency.  Fatigue/sleep:  Her fatigue is doing better since she is exercising more     She has some heat intolerance with more fatigue. As needed, she takes Adderall 10 mg by mouth twice a day with benefit. She denies any side effects.  She sleeps well most nights.  Mood:   She has been on Prozac 20 mg and tolerates it well.   She feels mood is doing very well.    She denies any significant cognitive issues.    Vit D:   She is on vit D supplements of a mild deficiency.  MS History:  who presented with a [place] sign, visual changes and numbness in 2006. MRIs were performed that were consistent with multiple sclerosis. Additionally, visual evoked potentials showed slowing of the P100. She was placed on Betaseron. She stopped when she was pregnant about 2 years later. She returned to Betaseron for about another year and one half after her son was born. Then she had some breakthrough relapses and she was switched to Tysabri and tolerated it very well. Unfortunately, after a couple years she  converted from negative to positive at a high titer.   She does not have  any exacerbations while on Tysabri. She then switched to Tecfidera. She's been on Tecfidera about 3 years now and has tolerated it well. She has not had any exacerbations while on it. MRIs have been stable.    REVIEW OF SYSTEMS:  Constitutional: No fevers, chills, sweats, or change in appetite.  Notes  Fatigue Eyes: No visual changes, double vision, eye pain Ear, nose and throat: No hearing loss, ear pain, nasal congestion, sore throat Cardiovascular: No chest pain, palpitations Respiratory:  No shortness of breath at rest or with exertion.   No wheezes GastrointestinaI: No nausea, vomiting, diarrhea, abdominal pain, fecal incontinence Genitourinary:  No dysuria, urinary retention or frequency.  No nocturia. Musculoskeletal:  No neck pain, back pain.   Reports a muscle spasm sensation in the right hip. Integumentary: No rash, pruritus, skin lesions Neurological: as above Psychiatric: Notes  depression at this time.  No anxiety Endocrine: No palpitations, diaphoresis, change in appetite, change in weigh or increased thirst Hematologic/Lymphatic:  No anemia, purpura, petechiae. Allergic/Immunologic: No itchy/runny eyes, nasal congestion, recent allergic reactions, rashes  ALLERGIES: No Known Allergies  HOME MEDICATIONS:  Current Outpatient Medications:  .  amphetamine-dextroamphetamine (ADDERALL) 10 MG tablet, Take one tablet 3 times daily as needed., Disp: 90 tablet, Rfl: 0 .  Biotin 10000 MCG TABS, Take by mouth., Disp: , Rfl:  .  cholecalciferol (VITAMIN D) 1000 units tablet, Take 5,000 Units by mouth daily., Disp: , Rfl:  .  Dimethyl Fumarate (TECFIDERA) 240 MG CPDR, TAKE 1 CAPSULE BY MOUTH TWICE DAILY -STORE AT ROOM TEMPERATURE IN ORIGINAL CONTAINER. DO NOT CRUSH OR CHEW. SWALLOW WHOLE, Disp: 180 capsule, Rfl: 3 .  FLUoxetine (PROZAC) 20 MG tablet, Take 1/2 to 1 TABLET daily, Disp: 90 tablet, Rfl: 3 .   linaclotide (LINZESS) 72 MCG capsule, TAKE 1 CAPSULE (72 MCG TOTAL) BY MOUTH DAILY BEFORE BREAKFAST., Disp: 90 capsule, Rfl: 4 .  meclizine (ANTIVERT) 25 MG tablet, Take 1 tablet (25 mg total) by mouth 3 (three) times daily as needed for dizziness., Disp: 90 tablet, Rfl: 0 .  UNABLE TO FIND, Vumerity (Titration pack--231mg  bid for 7 days, then 231mg , 2 tablets bid for 23 days),  Maintenance dose of 231mg , 2 tablets bid., Disp: , Rfl:    PAST MEDICAL HISTORY: Past Medical History:  Diagnosis Date  . Depression   . MS (multiple sclerosis) ([place])    sees Dr Kerman Passey in [facility]    PAST SURGICAL HISTORY: Past Surgical History:  Procedure Laterality Date  . LIPOSUCTION      FAMILY HISTORY: Family History  Problem Relation Age of Onset  . Drug abuse Mother        opiod addiction  . Arthritis Mother        back pain  . Arthritis Father   . Arthritis/Rheumatoid Father   . Ankylosing spondylitis Father   . Seizures Maternal Aunt   . Parkinsonism Maternal Grandfather   . Diabetes Maternal Grandfather   . Lupus Maternal Aunt   . Mental illness Brother        bipolar disorder  . Parkinson's disease Paternal Grandmother   . Arthritis Sister   . Mental illness Sister   . Drug abuse Sister     SOCIAL HISTORY:  Social History   Socioeconomic History  . Marital status: Married    Spouse name: Jeneen Rinks  . Number of children: 1  . Years of education: 69  . Highest education level: Not on file  Occupational History  . Occupation: Education officer, museum  Social Needs  . Financial resource strain: Not on file  . Food insecurity:    Worry: Not on file    Inability: Not on file  . Transportation needs:    Medical: Not on file    Non-medical: Not on file  Tobacco Use  . Smoking status: Never Smoker  . Smokeless tobacco: Never Used  Substance and Sexual Activity  . Alcohol use: Yes    Alcohol/week: 1.0 standard drinks    Types: 1 Standard drinks or equivalent per week  . Drug use: No  . Sexual  activity: Yes    Partners: Male    Birth control/protection: Surgical  Lifestyle  . Physical activity:    Days per week: Not on file    Minutes per session: Not on file  . Stress: Not on file  Relationships  . Social connections:    Talks on phone: Not on file    Gets together: Not on file    Attends religious service: Not on file    Active member of club or organization: Not on file    Attends meetings of clubs or organizations: Not on file    Relationship status: Not on file  . Intimate partner violence:    Fear of current or ex partner: Not on file    Emotionally abused: Not on file    Physically abused: Not on file    Forced sexual activity: Not on file  Other Topics Concern  . Not on file  Social History Narrative   Lives with husband and son, works as Education officer, museum, no dietary restrictions, exercises intermittently. Wears a seat belt regularly     PHYSICAL EXAM  Vitals:   11/13/18 1341  BP: 114/80  Pulse: 83  Resp: 16  Weight: 154 lb (69.9 kg)  Height: 5\' 2"  (1.575 m)    Body mass index is 28.17 kg/m.   General: The patient is well-developed and well-nourished and in no acute distress  Neurologic Exam  Mental status: The patient is alert and oriented x 3 at the time of the examination. The patient has apparent normal recent and remote memory, with an apparently normal attention span and concentration ability.   Speech is normal.  Cranial nerves: Extraocular movements are full.  Facial strength is normal.  Trapezius strength is normal.  No dysarthria is noted.    No obvious hearing deficits are noted.  Motor:  There are no fasciculations.  Muscle bulk is normal.  Muscle tone is normal.  Strength is 5/5.  Sensory: There is intact sensation to touch and vibration in the arms and legs.  Coordination: Cerebellar testing reveals good finger-nose-finger and heel-to-shin bilaterally.  Gait and station: Station and gait are normal.  The tandem gait is normal.   Romberg is negative.  Reflexes: Deep tendon reflexes are mildly increased in the legs.. No ankle clonus.    DIAGNOSTIC DATA (LABS, IMAGING, TESTING) - I reviewed patient records, labs, notes, testing and imaging myself where available.     ASSESSMENT AND PLAN  MS (multiple sclerosis) ([place])  Multiple sclerosis ([place]) - Plan: MR BRAIN W WO CONTRAST, CBC with Differential/Platelet  Other fatigue  High risk medication use  Numbness  Anxiety    1.  She continues to have some difficulty with GI/flushing side effects on Tecfidera.  A new medication, Vumerity, has just been FDA approved.  It has the same mechanism of action as Tecfidera and should be equal in efficacy but hopefully with better GI and flushing  side effects. 2.  We discussed getting another MRI of the brain to make sure that there is not any subclinical progression.  If this is occurring we will need to consider 1 of the other medications that might be more effective for her..     3.   rtc in 6 months or sooner if problems.     Keesha Pellum A. Felecia Shelling, MD, PhD 63/12/6008, 93:23 PM Certified in Neurology, Clinical Neurophysiology, Sleep Medicine, Pain Medicine and Neuroimaging  [facility] [address] [place], [place] [address] [phone]

## 2018-11-14 ENCOUNTER — Telehealth: Payer: Self-pay | Admitting: *Deleted

## 2018-11-14 LAB — CBC WITH DIFFERENTIAL/PLATELET
Basophils Absolute: 0.1 10*3/uL (ref 0.0–0.2)
Basos: 1 %
EOS (ABSOLUTE): 0.1 10*3/uL (ref 0.0–0.4)
Eos: 2 %
Hematocrit: 38 % (ref 34.0–46.6)
Hemoglobin: 13.3 g/dL (ref 11.1–15.9)
Immature Grans (Abs): 0 10*3/uL (ref 0.0–0.1)
Immature Granulocytes: 0 %
Lymphocytes Absolute: 1.5 10*3/uL (ref 0.7–3.1)
Lymphs: 22 %
MCH: 34.5 pg — ABNORMAL HIGH (ref 26.6–33.0)
MCHC: 35 g/dL (ref 31.5–35.7)
MCV: 99 fL — ABNORMAL HIGH (ref 79–97)
Monocytes Absolute: 0.4 10*3/uL (ref 0.1–0.9)
Monocytes: 7 %
Neutrophils Absolute: 4.5 10*3/uL (ref 1.4–7.0)
Neutrophils: 68 %
Platelets: 241 10*3/uL (ref 150–450)
RBC: 3.85 x10E6/uL (ref 3.77–5.28)
RDW: 13.1 % (ref 12.3–15.4)
WBC: 6.5 10*3/uL (ref 3.4–10.8)

## 2018-11-14 NOTE — Telephone Encounter (Signed)
-----   Message from Britt Bottom, MD sent at 11/14/2018  9:10 AM EST ----- Please let her know that the blood work looks good.  Her lymphocytes are 1.5, well into the normal range.

## 2018-11-14 NOTE — Telephone Encounter (Signed)
Left patient a detailed message, with results, on voicemail (ok per DPR).  Provided our number to call back with any questions.  

## 2018-11-17 ENCOUNTER — Telehealth: Payer: Self-pay | Admitting: *Deleted

## 2018-11-17 NOTE — Telephone Encounter (Signed)
Initiated PA Vumerity on covermymeds. Key: [id number].  Vumerity (Starter) 231MG  dr capsules. In process of completing.

## 2018-11-17 NOTE — Telephone Encounter (Signed)
PA submitted. Waiting on determination.

## 2018-11-18 NOTE — Addendum Note (Signed)
Addended by: Hope Pigeon on: 11/18/2018 10:17 AM   Modules accepted: Orders

## 2018-11-18 NOTE — Telephone Encounter (Signed)
Received fax notification from [employer] that [place] approved for max of 12 refills from 11/17/18-11/17/19. Ref#: [id number] PA approved for 4 caps per day.  Starter pack: 231mg x1 PO BIDx7 days #14 caps and 462mg  (231mg  x2) PO BIDx23 days #92 caps. Patient would need total of 106 capsules for the first 30 days since this is the starter pack. PA was approved for both starter pack and maintenance dose of: 462mg  (231mg x2) PO BID.

## 2018-11-21 ENCOUNTER — Ambulatory Visit: Payer: 59

## 2018-11-21 NOTE — Telephone Encounter (Signed)
Received fax from [place] that [place] shipped to pt.

## 2018-11-23 ENCOUNTER — Other Ambulatory Visit: Payer: 59

## 2018-11-24 ENCOUNTER — Ambulatory Visit
Admission: RE | Admit: 2018-11-24 | Discharge: 2018-11-24 | Disposition: A | Discharge status: Home / Self Care | Payer: 59 | Source: Ambulatory Visit | Attending: Neurology | Admitting: Neurology

## 2018-11-24 DIAGNOSIS — G35 Multiple sclerosis: Secondary | ICD-10-CM | POA: Diagnosis not present

## 2018-11-24 MED ORDER — GADOBENATE DIMEGLUMINE 529 MG/ML IV SOLN
14.0000 mL | Freq: Once | INTRAVENOUS | Status: AC | PRN
  Administered 2018-11-24: 14 mL via INTRAVENOUS

## 2018-12-18 ENCOUNTER — Other Ambulatory Visit: Payer: Self-pay | Admitting: *Deleted

## 2018-12-18 MED ORDER — AMPHETAMINE-DEXTROAMPHETAMINE 10 MG PO TABS: ORAL_TABLET | ORAL | 0 refills | Status: DC

## 2018-12-18 MED FILL — AMPHETAMINE-DEXTROAMPHETAMI: 10 | 30 days supply | Qty: 90 | Fill #0

## 2019-01-29 ENCOUNTER — Other Ambulatory Visit: Payer: Self-pay | Admitting: Neurology

## 2019-01-29 ENCOUNTER — Other Ambulatory Visit: Payer: Self-pay | Admitting: *Deleted

## 2019-01-29 MED ORDER — AMPHETAMINE-DEXTROAMPHETAMINE 10 MG PO TABS: ORAL_TABLET | ORAL | 0 refills | Status: DC

## 2019-01-29 MED FILL — AMPHETAMINE-DEXTROAMPHETAMI: 10 | 30 days supply | Qty: 90 | Fill #0

## 2019-01-29 NOTE — Progress Notes (Signed)
Sure, completed

## 2019-02-26 ENCOUNTER — Telehealth: Payer: Self-pay | Admitting: *Deleted

## 2019-02-26 ENCOUNTER — Other Ambulatory Visit: Payer: Self-pay | Admitting: *Deleted

## 2019-02-26 MED ORDER — AMPHETAMINE-DEXTROAMPHETAMINE 10 MG PO TABS: ORAL_TABLET | ORAL | 0 refills | Status: DC

## 2019-02-26 NOTE — Telephone Encounter (Signed)
Submitted PA Adderall on CMM. Key: [id number] - PA Case ID: [id number] - Rx #: [id number]. Waiting on determination.

## 2019-02-27 MED FILL — AMPHETAMINE-DEXTROAMPHETAMI: 10 | 30 days supply | Qty: 90 | Fill #0

## 2019-02-27 NOTE — Telephone Encounter (Signed)
PA approved by [employer] through 02/25/2020.  PA Ref#[id number].

## 2019-04-13 ENCOUNTER — Other Ambulatory Visit: Payer: Self-pay | Admitting: *Deleted

## 2019-04-13 ENCOUNTER — Other Ambulatory Visit: Payer: Self-pay

## 2019-04-13 MED ORDER — AMPHETAMINE-DEXTROAMPHETAMINE 10 MG PO TABS: ORAL_TABLET | ORAL | 0 refills | Status: DC

## 2019-04-13 MED FILL — AMPHETAMINE-DEXTROAMPHETAMI: 10 | 30 days supply | Qty: 90 | Fill #0

## 2019-04-20 ENCOUNTER — Other Ambulatory Visit: Payer: Self-pay | Admitting: Neurology

## 2019-04-20 MED ORDER — MECLIZINE HCL 25 MG PO TABS: 25.0000 mg | ORAL_TABLET | Freq: Three times a day (TID) | ORAL | 1 refills | Status: DC | PRN

## 2019-05-18 ENCOUNTER — Telehealth: Payer: Self-pay | Admitting: *Deleted

## 2019-05-18 NOTE — Telephone Encounter (Signed)
Called, LVM for pt asking her to reach out today about appt tomorrow. Was going to see if she was okay with doing mychart video visit d/t concerns over covid-19 still. Asked her to call or send mychart message

## 2019-05-19 ENCOUNTER — Telehealth (INDEPENDENT_AMBULATORY_CARE_PROVIDER_SITE_OTHER): Payer: 59 | Admitting: Neurology

## 2019-05-19 ENCOUNTER — Encounter: Payer: Self-pay | Admitting: Neurology

## 2019-05-19 DIAGNOSIS — G35 Multiple sclerosis: Secondary | ICD-10-CM

## 2019-05-19 DIAGNOSIS — F419 Anxiety disorder, unspecified: Secondary | ICD-10-CM

## 2019-05-19 DIAGNOSIS — R5383 Other fatigue: Secondary | ICD-10-CM | POA: Diagnosis not present

## 2019-05-19 DIAGNOSIS — Z79899 Other long term (current) drug therapy: Secondary | ICD-10-CM

## 2019-05-19 MED ORDER — AMPHETAMINE-DEXTROAMPHETAMINE 10 MG PO TABS: ORAL_TABLET | ORAL | 0 refills | Status: DC

## 2019-05-19 MED FILL — AMPHETAMINE-DEXTROAMPHETAMI: 10 | 30 days supply | Qty: 90 | Fill #0

## 2019-05-19 NOTE — Progress Notes (Signed)
[facility]  PATIENT: Laurie Cole DOB: Dec 01, 1976  REFERRING CLINICIAN: PCP is Penni Homans REASON FOR VISIT: MS   HISTORICAL  CHIEF COMPLAINT:  Chief Complaint  Patient presents with   Multiple Sclerosis    On Vumerity    HISTORY OF PRESENT ILLNESS:  Laurie Cole is a 43 y.o. woman with MS diagnosed in 2006.     Update 05/19/2019: Virtual Visit via Video Note I connected with [name] on 05/19/19 at  3:00 PM EDT by a video enabled telemedicine application and verified that I am speaking with the correct person.  I discussed the limitations of evaluation and management by telemedicine and the availability of in person appointments. The patient expressed understanding and agreed to proceed.  History of Present Illness: She feels her MS is stable.  She switch from Tecfidera to [place].  She is tolerating it better with less flushing.  She is not having any GI issues.  She denies any recent exacerbations.  She has not noted any progression of symptoms.  Her gait is doing well.  She remains physically active.  She denies any major problems with balance.  Strength, muscle tone and sensation is doing well.  She denies any significant problems with bladder function.  Vision is doing well.  She does note some difficulties with fatigue.  That is helped by the Adderall.    She is sleeping well most nights.  She denies any depression but has some anxiety.  Prozac has helped.  She is experiencing less anxiety and was able to cut the dose down to 10 mg.  Cognition is doing well.  Work is going well.       Observations/Objective: She is a well-developed well-nourished woman in no acute distress.  The head is normocephalic and atraumatic.  Sclera are anicteric.  Visible skin appears normal.  The neck has a good range of motion.    She is alert and fully oriented with fluent speech and good attention, knowledge and memory.  Extraocular muscles are intact.  Facial  strength is normal.   She appears to have normal strength in the arms.  Rapid alternating movements are performed well.  Assessment and Plan: 1. MS (multiple sclerosis) ([place])   2. High risk medication use   3. Other fatigue   4. Anxiety     1.   Continue Vumerity.  Sometime the next few months I would like to check a CBC with differential to make sure that she does not have significant lymphopenia.  Additionally, we will need to check an MRI of the brain and MRI of the cervical spine (we can do these without contrast) to determine if she is experiencing any subclinical progression.  If present, consider a switch to a different disease modifying therapy. 2.   Stay active and exercise as tolerated. 3.   Continue Prozac for anxiety. 4.   Continue Adderall as needed up to 3 times a day .5.   Return to see me in 6 months or sooner if there are new or worsening neurologic symptoms.  Follow Up Instructions: I discussed the assessment and treatment plan with the patient. The patient was provided an opportunity to ask questions and all were answered. The patient agreed with the plan and demonstrated an understanding of the instructions.    The patient was advised to call back or seek an in-person evaluation if the symptoms worsen or if the condition fails to improve as anticipated.  I provided 20  minutes of non-face-to-face time during this encounter.  _____________________________________________  from previous visits: Update 11/13/2018: She is doing well and has no recent exacerbation or new MS symptoms.  She still is having some side effects on Tecfidera, flushing greater than GI.  Her last MRI did not show any new MS lesions.  We will need to repeat another 1 to make sure there is not subclinical progression.  Her gait is fine and she has no trouble climbing stairs or doing other activities.  She denies any  weakness in the legs or any fixed numbness.  The numbness that she was experiencing earlier  this year has resolved.  She notes some fatigue that is helped by Adderall.  She also has some issues with attention deficit helped by Adderall.  Cognition is otherwise fine.  She sleeps well most nights.   Mood is stable.  Depression is doing well.  She has some anxiety but it is generally doing well.  She has a long history of IBS-C and is on Linzess.     Update 06/18/2018: She is noting more numbness, in the face and both arms and legs.    Symptoms started on Friday and are mostly mild.    She notes that the symptoms are symmetric and in her face more than in the arms.    She did have a headache the first day and spent much of the weekend outdoors.    She actually felt better over the weekend but the numbness returned today.     MRI of the brain 01/26/2018 showed a stable pattern of white matter foci in the cerbral hemispheres with one right cerebellar focus, c/w MS.   There were no new lesions compared to 12/30/2017     She is on Tecfidera and she tolerates it well.  She is noting more anxiety and has had more stress at work.  She is noting more fatigue,  Worse later in the day than in the morning.   She has some heat sensitivity.     She is sleeping well.   Adderall has helped her fatigue.      Update 04/17/2018: She is on Tecfidera and tolerates it well most days.  Some days, she still will have some flushing even when she takes after a meal..    Her last MRI was performed 01/25/2018 and showed no new lesions.  Images were personally reviewed.  She feels her gait is good but may drag the left leg a little bit if she walks rapidly before she is warmed up.     She remains active and exercises regularly.   She has lost a couple pounds.     Vision is normal.    She has had occasional stress incontinence with sneeze/laugh but not otherwise.     Her fatigue is doing well.    She is sleeping well.    Adderall has helped her fatigue and her mild attentional issues.  Mood is doing well.  She notes  occasional fasciculations but these have not worsened any..   She is on Lumify for her red eye.     Update 10/17/2017:   She is on Tecfidera for her MS. She tolerates it well. She has not had any exacerbations. He now and then she will have some flushing. Lymphocytes are checked on a regular basis and they have been in the normal range. The MRI of the brain 12/29/2016 showed white matter foci consistent with her diagnosis of MS but there  were no changes when compared to the previous MRI. She denies any difficulty with her gait. There is no numbness, weakness or change in her vision. Bladder is fine. Her fatigue is doing fairly well. Adderall helps. She is concerned about some recent weight gain and is going to try to eat better and exercise more. Of note, she eats more in the morning than she normally would as it reduces the flushing with the Tecfidera. She has tried some days moving her first dose of Tecfidera to after lunch and does better. Mood and cognition are doing well.                                                           From 01/03/2017:   MS:  For MS is stable. She has no new exacerbations. She has been on Tecfidera since 2013 and she tolerates it reasonably well but continues to have some flushing    Lymphocytes have been checked every 6 months and have been in the normal range.   Flushing is better with moving the timing of her pills and taking aspirin.      Her MRI of the brain 12/29/2016 with and without contrast shows T2/FLAIR hyperintense foci in a pattern and configuration consistent with chronic demyelinating plaques of MS. There is no interval change (compared to 12/2015) and there are no acute findings on the current MRI.         Muscle fasciculations:  She has occasional muscle fasciculations, worse when tired.  Her EMG/NCV was normal.      She denies any significant issues with gait, numbness, weakness or vision. She did have numbness in the past at the time of diagnosis.  She has no  bladder urgency or frequency.  Fatigue/sleep:  Her fatigue is doing better since she is exercising more     She has some heat intolerance with more fatigue. As needed, she takes Adderall 10 mg by mouth twice a day with benefit. She denies any side effects.  She sleeps well most nights.  Mood:   She has been on Prozac 20 mg and tolerates it well.   She feels mood is doing very well.    She denies any significant cognitive issues.    Vit D:   She is on vit D supplements of a mild deficiency.  MS History:  who presented with a [place] sign, visual changes and numbness in 2006. MRIs were performed that were consistent with multiple sclerosis. Additionally, visual evoked potentials showed slowing of the P100. She was placed on Betaseron. She stopped when she was pregnant about 2 years later. She returned to Betaseron for about another year and one half after her son was born. Then she had some breakthrough relapses and she was switched to Tysabri and tolerated it very well. Unfortunately, after a couple years she converted from negative to positive at a high titer.   She does not have any exacerbations while on Tysabri. She then switched to Tecfidera. She's been on Tecfidera about 3 years now and has tolerated it well. She has not had any exacerbations while on it. MRIs have been stable.    REVIEW OF SYSTEMS:  Constitutional: No fevers, chills, sweats, or change in appetite.  Notes  Fatigue Eyes: No visual changes, double vision, eye pain  Ear, nose and throat: No hearing loss, ear pain, nasal congestion, sore throat Cardiovascular: No chest pain, palpitations Respiratory:  No shortness of breath at rest or with exertion.   No wheezes GastrointestinaI: No nausea, vomiting, diarrhea, abdominal pain, fecal incontinence Genitourinary:  No dysuria, urinary retention or frequency.  No nocturia. Musculoskeletal:  No neck pain, back pain.   Reports a muscle spasm sensation in the right hip. Integumentary: No  rash, pruritus, skin lesions Neurological: as above Psychiatric: Notes  depression at this time.  No anxiety Endocrine: No palpitations, diaphoresis, change in appetite, change in weigh or increased thirst Hematologic/Lymphatic:  No anemia, purpura, petechiae. Allergic/Immunologic: No itchy/runny eyes, nasal congestion, recent allergic reactions, rashes  ALLERGIES: No Known Allergies  HOME MEDICATIONS:  Current Outpatient Medications:    amphetamine-dextroamphetamine (ADDERALL) 10 MG tablet, Take one tablet 3 times daily as needed., Disp: 90 tablet, Rfl: 0   Biotin 10000 MCG TABS, Take by mouth., Disp: , Rfl:    cholecalciferol (VITAMIN D) 1000 units tablet, Take 5,000 Units by mouth daily., Disp: , Rfl:    Diroximel Fumarate, Starter, (VUMERITY, STARTER,) 231 MG CPDR, Take by mouth., Disp: , Rfl:    FLUoxetine (PROZAC) 20 MG tablet, Take 1/2 to 1 TABLET daily, Disp: 90 tablet, Rfl: 3   linaclotide (LINZESS) 72 MCG capsule, TAKE 1 CAPSULE (72 MCG TOTAL) BY MOUTH DAILY BEFORE BREAKFAST., Disp: 90 capsule, Rfl: 4   meclizine (ANTIVERT) 25 MG tablet, Take 1 tablet (25 mg total) by mouth 3 (three) times daily as needed for dizziness., Disp: 90 tablet, Rfl: 1   UNABLE TO FIND, Vumerity (Titration pack--231mg  bid for 7 days, then 231mg , 2 tablets bid for 23 days),  Maintenance dose of 231mg , 2 tablets bid., Disp: , Rfl:    PAST MEDICAL HISTORY: Past Medical History:  Diagnosis Date   Depression    MS (multiple sclerosis) ([place])    sees Dr Kerman Passey in [facility]    PAST SURGICAL HISTORY: Past Surgical History:  Procedure Laterality Date   LIPOSUCTION      FAMILY HISTORY: Family History  Problem Relation Age of Onset   Drug abuse Mother        opiod addiction   Arthritis Mother        back pain   Arthritis Father    Arthritis/Rheumatoid Father    Ankylosing spondylitis Father    Seizures Maternal Aunt    Parkinsonism Maternal Grandfather    Diabetes Maternal  Grandfather    Lupus Maternal Aunt    Mental illness Brother        bipolar disorder   Parkinson's disease Paternal Grandmother    Arthritis Sister    Mental illness Sister    Drug abuse Sister     SOCIAL HISTORY:  Social History   Socioeconomic History   Marital status: Married    Spouse name: Jeneen Rinks   Number of children: 1   Years of education: 18   Highest education level: Not on file  Occupational History   Occupation: Education officer, museum  Scientist, product/process development strain: Not on file   Food insecurity    Worry: Not on file    Inability: Not on file   Transportation needs    Medical: Not on file    Non-medical: Not on file  Tobacco Use   Smoking status: Never Smoker   Smokeless tobacco: Never Used  Substance and Sexual Activity   Alcohol use: Yes    Alcohol/week: 1.0 standard drinks  Types: 1 Standard drinks or equivalent per week   Drug use: No   Sexual activity: Yes    Partners: Male    Birth control/protection: Surgical  Lifestyle   Physical activity    Days per week: Not on file    Minutes per session: Not on file   Stress: Not on file  Relationships   Social connections    Talks on phone: Not on file    Gets together: Not on file    Attends religious service: Not on file    Active member of club or organization: Not on file    Attends meetings of clubs or organizations: Not on file    Relationship status: Not on file   Intimate partner violence    Fear of current or ex partner: Not on file    Emotionally abused: Not on file    Physically abused: Not on file    Forced sexual activity: Not on file  Other Topics Concern   Not on file  Social History Narrative   Lives with husband and son, works as Education officer, museum, no dietary restrictions, exercises intermittently. Wears a seat belt regularly     PHYSICAL EXAM  There were no vitals filed for this visit.  There is no height or weight on file to calculate  BMI.   General: The patient is well-developed and well-nourished and in no acute distress  Neurologic Exam  Mental status: The patient is alert and oriented x 3 at the time of the examination. The patient has apparent normal recent and remote memory, with an apparently normal attention span and concentration ability.   Speech is normal.  Cranial nerves: Extraocular movements are full.  Facial strength is normal.  Trapezius strength is normal.  No dysarthria is noted.    No obvious hearing deficits are noted.  Motor:  There are no fasciculations.  Muscle bulk is normal.  Muscle tone is normal.  Strength is 5/5.  Sensory: There is intact sensation to touch and vibration in the arms and legs.  Coordination: Cerebellar testing reveals good finger-nose-finger and heel-to-shin bilaterally.  Gait and station: Station and gait are normal.  The tandem gait is normal.  Romberg is negative.  Reflexes: Deep tendon reflexes are mildly increased in the legs.. No ankle clonus.    Adal Sereno A. Felecia Shelling, MD, PhD 5/36/1443, 1:54 PM Certified in Neurology, Clinical Neurophysiology, Sleep Medicine, Pain Medicine and Neuroimaging  [facility] [address] [place], [place] [address] [phone]

## 2019-05-22 MED FILL — FLUOXETINE HCL 20 MG TABS: 20 | 30 days supply | Qty: 30 | Fill #1

## 2019-05-22 MED FILL — MECLIZINE 25 MG TABLET: 25 | 30 days supply | Qty: 90 | Fill #0

## 2019-06-06 ENCOUNTER — Encounter: Payer: Self-pay | Admitting: Family Medicine

## 2019-06-06 DIAGNOSIS — G35 Multiple sclerosis: Secondary | ICD-10-CM

## 2019-06-06 DIAGNOSIS — Z0000 Encounter for general adult medical examination without abnormal findings: Secondary | ICD-10-CM

## 2019-06-24 ENCOUNTER — Encounter: Payer: Self-pay | Admitting: Family Medicine

## 2019-06-24 NOTE — Telephone Encounter (Signed)
Dr Charlett Blake -- we could draw her labs today at 10 but I do not see an future orders in Epic. Please advise?

## 2019-06-25 ENCOUNTER — Telehealth: Payer: Self-pay | Admitting: Neurology

## 2019-06-25 ENCOUNTER — Other Ambulatory Visit: Payer: Self-pay | Admitting: *Deleted

## 2019-06-25 DIAGNOSIS — Z79899 Other long term (current) drug therapy: Secondary | ICD-10-CM

## 2019-06-25 DIAGNOSIS — G35 Multiple sclerosis: Secondary | ICD-10-CM

## 2019-06-25 NOTE — Telephone Encounter (Signed)
See PCP's response in previous message.

## 2019-06-25 NOTE — Telephone Encounter (Signed)
[place] [employer] is trying to reach pt and wants to know if someone can reach out to pt and advise her to call them   [phone]

## 2019-07-01 ENCOUNTER — Other Ambulatory Visit: Payer: Self-pay | Admitting: *Deleted

## 2019-07-01 MED ORDER — AMPHETAMINE-DEXTROAMPHETAMINE 10 MG PO TABS: ORAL_TABLET | ORAL | 0 refills | Status: DC

## 2019-07-01 MED FILL — AMPHETAMINE-DEXTROAMPHETAMI: 10 | 30 days supply | Qty: 90 | Fill #0

## 2019-07-01 NOTE — Addendum Note (Signed)
Addended by: Magdalene Molly A on: 07/01/2019 01:07 PM   Modules accepted: Orders

## 2019-07-01 NOTE — Telephone Encounter (Signed)
I went ahead and placed the basic four labs for her

## 2019-07-01 NOTE — Telephone Encounter (Signed)
Relation to pt: self  Call back number: [phone]    Reason for call:  Patient extremely frustrated and would like to speak with PCP directly regarding orders being placed by  [facility]   Patient states she doesn't care who gets billed she just needs her labs done and PCP approved, please advise as soon as possible.

## 2019-07-01 NOTE — Telephone Encounter (Signed)
Patient sent my chart stating she needs labs done by her [place] physician I do not see any orders that you want me to place. Let me know and ill order them   Please advise

## 2019-07-01 NOTE — Telephone Encounter (Signed)
Spoke with patient about labs awaiting on Dr. Charlett Blake to give the orders and I will place for the patient

## 2019-07-01 NOTE — Telephone Encounter (Signed)
Dr Charlett Blake -- Pt also asked about Cd4/Cd8 counts. Please place future orders or let me know if we cannot order those for the pt.

## 2019-07-02 ENCOUNTER — Other Ambulatory Visit (INDEPENDENT_AMBULATORY_CARE_PROVIDER_SITE_OTHER): Payer: 59

## 2019-07-02 ENCOUNTER — Other Ambulatory Visit: Payer: Self-pay

## 2019-07-02 ENCOUNTER — Other Ambulatory Visit: Payer: Self-pay | Admitting: Neurology

## 2019-07-02 DIAGNOSIS — G35 Multiple sclerosis: Secondary | ICD-10-CM | POA: Diagnosis not present

## 2019-07-02 DIAGNOSIS — D72810 Lymphocytopenia: Secondary | ICD-10-CM

## 2019-07-02 DIAGNOSIS — Z0000 Encounter for general adult medical examination without abnormal findings: Secondary | ICD-10-CM

## 2019-07-02 DIAGNOSIS — Z79899 Other long term (current) drug therapy: Secondary | ICD-10-CM

## 2019-07-02 LAB — LIPID PANEL
Cholesterol: 166 mg/dL (ref 0–200)
HDL: 52.6 mg/dL (ref >39.00)
LDL Cholesterol: 97 mg/dL (ref 0–99)
NonHDL: 112.98
Total CHOL/HDL Ratio: 3
Triglycerides: 78 mg/dL (ref 0.0–149.0)
VLDL: 15.6 mg/dL (ref 0.0–40.0)

## 2019-07-02 LAB — CBC WITH DIFFERENTIAL/PLATELET
Basophils Absolute: 0 10*3/uL (ref 0.0–0.1)
Basophils Relative: 0.9 % (ref 0.0–3.0)
Eosinophils Absolute: 0.1 10*3/uL (ref 0.0–0.7)
Eosinophils Relative: 2.8 % (ref 0.0–5.0)
HCT: 36.7 % (ref 36.0–46.0)
Hemoglobin: 13 g/dL (ref 12.0–15.0)
Lymphocytes Relative: 18.2 % (ref 12.0–46.0)
Lymphs Abs: 0.7 10*3/uL (ref 0.7–4.0)
MCHC: 35.4 g/dL (ref 30.0–36.0)
MCV: 97.6 fl (ref 78.0–100.0)
Monocytes Absolute: 0.3 10*3/uL (ref 0.1–1.0)
Monocytes Relative: 7.7 % (ref 3.0–12.0)
Neutro Abs: 2.8 10*3/uL (ref 1.4–7.7)
Neutrophils Relative %: 70.4 % (ref 43.0–77.0)
Platelets: 211 10*3/uL (ref 150.0–400.0)
RBC: 3.76 Mil/uL — ABNORMAL LOW (ref 3.87–5.11)
RDW: 12.8 % (ref 11.5–15.5)
WBC: 4 10*3/uL (ref 4.0–10.5)

## 2019-07-02 LAB — COMPREHENSIVE METABOLIC PANEL
ALT: 19 U/L (ref 0–35)
AST: 23 U/L (ref 0–37)
Albumin: 4.4 g/dL (ref 3.5–5.2)
Alkaline Phosphatase: 42 U/L (ref 39–117)
BUN: 11 mg/dL (ref 6–23)
CO2: 25 mEq/L (ref 19–32)
Calcium: 9.1 mg/dL (ref 8.4–10.5)
Chloride: 107 mEq/L (ref 96–112)
Creatinine, Ser: 0.7 mg/dL (ref 0.40–1.20)
GFR: 92.22 mL/min (ref >60.00)
Glucose, Bld: 83 mg/dL (ref 70–99)
Potassium: 3.8 mEq/L (ref 3.5–5.1)
Sodium: 140 mEq/L (ref 135–145)
Total Bilirubin: 0.9 mg/dL (ref 0.2–1.2)
Total Protein: 6.6 g/dL (ref 6.0–8.3)

## 2019-07-02 LAB — TSH: TSH: 2.6 u[IU]/mL (ref 0.35–4.50)

## 2019-07-02 NOTE — Telephone Encounter (Signed)
Patient was very upset yesterday bc lab orders was not placed.... She wanted to come here to have her labs done. I went ahead and ordered the basic 4 for her

## 2019-07-22 ENCOUNTER — Other Ambulatory Visit: Payer: Self-pay

## 2019-07-22 ENCOUNTER — Other Ambulatory Visit (INDEPENDENT_AMBULATORY_CARE_PROVIDER_SITE_OTHER): Payer: Self-pay

## 2019-07-22 DIAGNOSIS — Z79899 Other long term (current) drug therapy: Secondary | ICD-10-CM

## 2019-07-22 DIAGNOSIS — G35 Multiple sclerosis: Secondary | ICD-10-CM

## 2019-07-22 DIAGNOSIS — D72810 Lymphocytopenia: Secondary | ICD-10-CM | POA: Diagnosis not present

## 2019-07-22 DIAGNOSIS — Z0289 Encounter for other administrative examinations: Secondary | ICD-10-CM

## 2019-07-23 LAB — T-HELPER CELLS CD4/CD8 %
% CD 4 Pos. Lymph.: 66.2 % — ABNORMAL HIGH (ref 30.8–58.5)
Absolute CD 4 Helper: 463 /uL (ref 359–1519)
Basophils Absolute: 0 10*3/uL (ref 0.0–0.2)
Basos: 1 %
CD3+CD4+ Cells/CD3+CD8+ Cells Bld: 7.88 — ABNORMAL HIGH (ref 0.92–3.72)
CD3+CD8+ Cells # Bld: 59 /uL — ABNORMAL LOW (ref 109–897)
CD3+CD8+ Cells NFr Bld: 8.4 % — ABNORMAL LOW (ref 12.0–35.5)
EOS (ABSOLUTE): 0.1 10*3/uL (ref 0.0–0.4)
Eos: 2 %
Hematocrit: 37.5 % (ref 34.0–46.6)
Hemoglobin: 12.8 g/dL (ref 11.1–15.9)
Immature Grans (Abs): 0 10*3/uL (ref 0.0–0.1)
Immature Granulocytes: 0 %
Lymphocytes Absolute: 0.7 10*3/uL (ref 0.7–3.1)
Lymphs: 15 %
MCH: 33.2 pg — ABNORMAL HIGH (ref 26.6–33.0)
MCHC: 34.1 g/dL (ref 31.5–35.7)
MCV: 97 fL (ref 79–97)
Monocytes Absolute: 0.3 10*3/uL (ref 0.1–0.9)
Monocytes: 7 %
Neutrophils Absolute: 3.5 10*3/uL (ref 1.4–7.0)
Neutrophils: 75 %
Platelets: 255 10*3/uL (ref 150–450)
RBC: 3.86 x10E6/uL (ref 3.77–5.28)
RDW: 13.3 % (ref 11.7–15.4)
WBC: 4.7 10*3/uL (ref 3.4–10.8)

## 2019-08-17 ENCOUNTER — Encounter: Payer: Self-pay | Admitting: Family Medicine

## 2019-08-18 ENCOUNTER — Other Ambulatory Visit: Payer: Self-pay | Admitting: *Deleted

## 2019-08-18 MED ORDER — AMPHETAMINE-DEXTROAMPHETAMINE 10 MG PO TABS: ORAL_TABLET | ORAL | 0 refills | Status: DC

## 2019-08-18 MED FILL — AMPHETAMINE-DEXTROAMPHETAMI: 10 | 30 days supply | Qty: 90 | Fill #0

## 2019-09-09 ENCOUNTER — Ambulatory Visit (INDEPENDENT_AMBULATORY_CARE_PROVIDER_SITE_OTHER): Payer: 59 | Admitting: *Deleted

## 2019-09-09 ENCOUNTER — Other Ambulatory Visit: Payer: Self-pay

## 2019-09-09 DIAGNOSIS — Z23 Encounter for immunization: Secondary | ICD-10-CM | POA: Diagnosis not present

## 2019-09-09 NOTE — Progress Notes (Signed)
Patient here today for flu vaccine.    Vaccine given in right deltoid and patient tolerated well.

## 2019-09-15 ENCOUNTER — Other Ambulatory Visit: Payer: Self-pay | Admitting: *Deleted

## 2019-09-15 DIAGNOSIS — G35 Multiple sclerosis: Secondary | ICD-10-CM

## 2019-09-15 DIAGNOSIS — Z79899 Other long term (current) drug therapy: Secondary | ICD-10-CM

## 2019-09-15 MED ORDER — AMPHETAMINE-DEXTROAMPHETAMINE 10 MG PO TABS: ORAL_TABLET | ORAL | 0 refills | Status: DC

## 2019-09-15 NOTE — Telephone Encounter (Signed)
Checked drug registry. She last refilled adderall 08/18/2019 #90. Last seen 05/19/2019 and next f/u 11/18/2019

## 2019-09-28 MED FILL — AMPHETAMINE-DEXTROAMPHETAMI: 10 | 30 days supply | Qty: 90 | Fill #0

## 2019-09-28 MED FILL — FLUOXETINE HCL 20 MG TABS: 20 | 30 days supply | Qty: 30 | Fill #2

## 2019-10-13 ENCOUNTER — Other Ambulatory Visit: Payer: Self-pay

## 2019-10-13 ENCOUNTER — Other Ambulatory Visit (INDEPENDENT_AMBULATORY_CARE_PROVIDER_SITE_OTHER): Payer: Self-pay

## 2019-10-13 DIAGNOSIS — G35 Multiple sclerosis: Secondary | ICD-10-CM | POA: Diagnosis not present

## 2019-10-13 DIAGNOSIS — Z79899 Other long term (current) drug therapy: Secondary | ICD-10-CM | POA: Diagnosis not present

## 2019-10-13 DIAGNOSIS — Z0289 Encounter for other administrative examinations: Secondary | ICD-10-CM

## 2019-10-14 LAB — T-HELPER CELLS CD4/CD8 %
% CD 4 Pos. Lymph.: 62.6 % — ABNORMAL HIGH (ref 30.8–58.5)
Absolute CD 4 Helper: 376 /uL (ref 359–1519)
Basophils Absolute: 0 10*3/uL (ref 0.0–0.2)
Basos: 1 %
CD3+CD4+ Cells/CD3+CD8+ Cells Bld: 10.43 — ABNORMAL HIGH (ref 0.92–3.72)
CD3+CD8+ Cells # Bld: 36 /uL — ABNORMAL LOW (ref 109–897)
CD3+CD8+ Cells NFr Bld: 6 % — ABNORMAL LOW (ref 12.0–35.5)
EOS (ABSOLUTE): 0.1 10*3/uL (ref 0.0–0.4)
Eos: 2 %
Hematocrit: 39.6 % (ref 34.0–46.6)
Hemoglobin: 13.3 g/dL (ref 11.1–15.9)
Immature Grans (Abs): 0 10*3/uL (ref 0.0–0.1)
Immature Granulocytes: 0 %
Lymphocytes Absolute: 0.6 10*3/uL — ABNORMAL LOW (ref 0.7–3.1)
Lymphs: 15 %
MCH: 33.9 pg — ABNORMAL HIGH (ref 26.6–33.0)
MCHC: 33.6 g/dL (ref 31.5–35.7)
MCV: 101 fL — ABNORMAL HIGH (ref 79–97)
Monocytes Absolute: 0.3 10*3/uL (ref 0.1–0.9)
Monocytes: 8 %
Neutrophils Absolute: 2.9 10*3/uL (ref 1.4–7.0)
Neutrophils: 74 %
Platelets: 239 10*3/uL (ref 150–450)
RBC: 3.92 x10E6/uL (ref 3.77–5.28)
RDW: 13.1 % (ref 11.7–15.4)
WBC: 3.8 10*3/uL (ref 3.4–10.8)

## 2019-11-10 ENCOUNTER — Other Ambulatory Visit: Payer: Self-pay | Admitting: *Deleted

## 2019-11-10 MED ORDER — AMPHETAMINE-DEXTROAMPHETAMINE 10 MG PO TABS: ORAL_TABLET | ORAL | 0 refills | Status: DC

## 2019-11-10 MED FILL — AMPHETAMINE-DEXTROAMPHETAMI: 10 | 30 days supply | Qty: 90 | Fill #0

## 2019-11-12 MED FILL — FLUOXETINE HCL 20 MG TABS: 20 | 30 days supply | Qty: 30 | Fill #3

## 2019-11-17 ENCOUNTER — Telehealth: Payer: Self-pay | Admitting: *Deleted

## 2019-11-17 NOTE — Telephone Encounter (Signed)
Faxed signed orders back for Vumerity refill 231mg  cap #120, 11 refills to [facility]. Fax: [phone]. Received fax confirmation. Per MD, keep instructions as 2 caps po BID. Pt currently taking 1 qam and 2 q pm but she may go back to full dose depending on what repeat labs show.

## 2019-11-18 ENCOUNTER — Ambulatory Visit (INDEPENDENT_AMBULATORY_CARE_PROVIDER_SITE_OTHER): Payer: 59 | Admitting: Neurology

## 2019-11-18 ENCOUNTER — Encounter: Payer: Self-pay | Admitting: Neurology

## 2019-11-18 ENCOUNTER — Other Ambulatory Visit: Payer: Self-pay

## 2019-11-18 VITALS — BP 130/72 | HR 91 | Ht 64.0 in | Wt 154.0 lb

## 2019-11-18 DIAGNOSIS — Z79899 Other long term (current) drug therapy: Secondary | ICD-10-CM

## 2019-11-18 DIAGNOSIS — R5383 Other fatigue: Secondary | ICD-10-CM | POA: Diagnosis not present

## 2019-11-18 DIAGNOSIS — G35 Multiple sclerosis: Secondary | ICD-10-CM | POA: Diagnosis not present

## 2019-11-18 NOTE — Progress Notes (Signed)
[facility]  PATIENT: Laurie Cole DOB: 1976/03/09  REFERRING CLINICIAN: PCP is Penni Homans REASON FOR VISIT: MS   HISTORICAL  CHIEF COMPLAINT:  Chief Complaint  Patient presents with  . Multiple Sclerosis    RM 12. Alone. Vumerity going well but her lymphocyte count is low.    HISTORY OF PRESENT ILLNESS:  Laurie Cole is a 43 y.o. woman with MS diagnosed in 2006.     Update 11/18/2019:  She feels she is doing well with her MS and has no new symptoms. She has tolerated oit better than Tecfidera with less flushing and GI symptoms.  Due to low lymphocytes of 0.7, we reduced Vumerity to 231 in am and 462 in pm.   Lymphocytes dropped a little further to 0.6 and we discussed checking again in about a month  We discussed options if the lymphocytes do not recover by next month.    She is JCV high positive and is concerned about PML risks.    She continues to do well with her MS.  Gait is normal and she is physically active.  Strength and muscle tone and sensation normal.  Bladder function and vision are doing well.  She has some fatigue improved with Adderall.  She sleeps well most nights.  Mood is doing well.  Update 05/19/2019 (virtual) She feels her MS is stable.  She switch from Tecfidera to Ton[place]a.  She is tolerating it better with less flushing.  She is not having any GI issues.  She denies any recent exacerbations.  She has not noted any progression of symptoms.  Her gait is doing well.  She remains physically active.  She denies any major problems with balance.  Strength, muscle tone and sensation is doing well.  She denies any significant problems with bladder function.  Vision is doing well.  She does note some difficulties with fatigue.  That is helped by the Adderall.    She is sleeping well most nights.  She denies any depression but has some anxiety.  Prozac has helped.  She is experiencing less anxiety and was able to cut the dose down to 10 mg.   Cognition is doing well.  Work is going well.       Update 11/13/2018: She is doing well and has no recent exacerbation or new MS symptoms.  She still is having some side effects on Tecfidera, flushing greater than GI.  Her last MRI did not show any new MS lesions.  We will need to repeat another 1 to make sure there is not subclinical progression.  Her gait is fine and she has no trouble climbing stairs or doing other activities.  She denies any  weakness in the legs or any fixed numbness.  The numbness that she was experiencing earlier this year has resolved.  She notes some fatigue that is helped by Adderall.  She also has some issues with attention deficit helped by Adderall.  Cognition is otherwise fine.  She sleeps well most nights.   Mood is stable.  Depression is doing well.  She has some anxiety but it is generally doing well.  She has a long history of IBS-C and is on Linzess.     Update 06/18/2018: She is noting more numbness, in the face and both arms and legs.    Symptoms started on Friday and are mostly mild.    She notes that the symptoms are symmetric and in her face more than in the arms.  She did have a headache the first day and spent much of the weekend outdoors.    She actually felt better over the weekend but the numbness returned today.     MRI of the brain 01/26/2018 showed a stable pattern of white matter foci in the cerbral hemispheres with one right cerebellar focus, c/w MS.   There were no new lesions compared to 12/30/2017     She is on Tecfidera and she tolerates it well.  She is noting more anxiety and has had more stress at work.  She is noting more fatigue,  Worse later in the day than in the morning.   She has some heat sensitivity.     She is sleeping well.   Adderall has helped her fatigue.      Update 04/17/2018: She is on Tecfidera and tolerates it well most days.  Some days, she still will have some flushing even when she takes after a meal..    Her last MRI was  performed 01/25/2018 and showed no new lesions.  Images were personally reviewed.  She feels her gait is good but may drag the left leg a little bit if she walks rapidly before she is warmed up.     She remains active and exercises regularly.   She has lost a couple pounds.     Vision is normal.    She has had occasional stress incontinence with sneeze/laugh but not otherwise.     Her fatigue is doing well.    She is sleeping well.    Adderall has helped her fatigue and her mild attentional issues.  Mood is doing well.  She notes occasional fasciculations but these have not worsened any..   She is on Lumify for her red eye.     Update 10/17/2017:   She is on Tecfidera for her MS. She tolerates it well. She has not had any exacerbations. He now and then she will have some flushing. Lymphocytes are checked on a regular basis and they have been in the normal range. The MRI of the brain 12/29/2016 showed white matter foci consistent with her diagnosis of MS but there were no changes when compared to the previous MRI. She denies any difficulty with her gait. There is no numbness, weakness or change in her vision. Bladder is fine. Her fatigue is doing fairly well. Adderall helps. She is concerned about some recent weight gain and is going to try to eat better and exercise more. Of note, she eats more in the morning than she normally would as it reduces the flushing with the Tecfidera. She has tried some days moving her first dose of Tecfidera to after lunch and does better. Mood and cognition are doing well.                                                           From 01/03/2017:   MS:  For MS is stable. She has no new exacerbations. She has been on Tecfidera since 2013 and she tolerates it reasonably well but continues to have some flushing    Lymphocytes have been checked every 6 months and have been in the normal range.   Flushing is better with moving the timing of her pills and taking aspirin.      Her  MRI  of the brain 12/29/2016 with and without contrast shows T2/FLAIR hyperintense foci in a pattern and configuration consistent with chronic demyelinating plaques of MS. There is no interval change (compared to 12/2015) and there are no acute findings on the current MRI.         Muscle fasciculations:  She has occasional muscle fasciculations, worse when tired.  Her EMG/NCV was normal.      She denies any significant issues with gait, numbness, weakness or vision. She did have numbness in the past at the time of diagnosis.  She has no bladder urgency or frequency.  Fatigue/sleep:  Her fatigue is doing better since she is exercising more     She has some heat intolerance with more fatigue. As needed, she takes Adderall 10 mg by mouth twice a day with benefit. She denies any side effects.  She sleeps well most nights.  Mood:   She has been on Prozac 20 mg and tolerates it well.   She feels mood is doing very well.    She denies any significant cognitive issues.    Vit D:   She is on vit D supplements of a mild deficiency.  MS History:  who presented with a Lhermite sign, visual changes and numbness in 2006. MRIs were performed that were consistent with multiple sclerosis. Additionally, visual evoked potentials showed slowing of the P100. She was placed on Betaseron. She stopped when she was pregnant about 2 years later. She returned to Betaseron for about another year and one half after her son was born. Then she had some breakthrough relapses and she was switched to Tysabri and tolerated it very well. Unfortunately, after a couple years she converted from negative to positive at a high titer.   She does not have any exacerbations while on Tysabri. She then switched to Tecfidera. She's been on Tecfidera about 3 years now and has tolerated it well. She has not had any exacerbations while on it. MRIs have been stable.    REVIEW OF SYSTEMS:  Constitutional: No fevers, chills, sweats, or change in appetite.   Notes  Fatigue Eyes: No visual changes, double vision, eye pain Ear, nose and throat: No hearing loss, ear pain, nasal congestion, sore throat Cardiovascular: No chest pain, palpitations Respiratory:  No shortness of breath at rest or with exertion.   No wheezes GastrointestinaI: No nausea, vomiting, diarrhea, abdominal pain, fecal incontinence Genitourinary:  No dysuria, urinary retention or frequency.  No nocturia. Musculoskeletal:  No neck pain, back pain.   Reports a muscle spasm sensation in the right hip. Integumentary: No rash, pruritus, skin lesions Neurological: as above Psychiatric: Notes  depression at this time.  No anxiety Endocrine: No palpitations, diaphoresis, change in appetite, change in weigh or increased thirst Hematologic/Lymphatic:  No anemia, purpura, petechiae. Allergic/Immunologic: No itchy/runny eyes, nasal congestion, recent allergic reactions, rashes  ALLERGIES: No Known Allergies  HOME MEDICATIONS:  Current Outpatient Medications:  .  amphetamine-dextroamphetamine (ADDERALL) 10 MG tablet, Take one tablet 3 times daily as needed., Disp: 90 tablet, Rfl: 0 .  Biotin 10000 MCG TABS, Take by mouth., Disp: , Rfl:  .  cholecalciferol (VITAMIN D) 1000 units tablet, Take 5,000 Units by mouth daily., Disp: , Rfl:  .  Diroximel Fumarate, Starter, (VUMERITY, STARTER,) 231 MG CPDR, Take by mouth., Disp: , Rfl:  .  FLUoxetine (PROZAC) 20 MG tablet, Take 1/2 to 1 TABLET daily, Disp: 90 tablet, Rfl: 3 .  linaclotide (LINZESS) 72 MCG capsule, TAKE 1 CAPSULE (  72 MCG TOTAL) BY MOUTH DAILY BEFORE BREAKFAST., Disp: 90 capsule, Rfl: 4 .  meclizine (ANTIVERT) 25 MG tablet, Take 1 tablet (25 mg total) by mouth 3 (three) times daily as needed for dizziness., Disp: 90 tablet, Rfl: 1 .  OVER THE COUNTER MEDICATION, "immune defense", Disp: , Rfl:  .  UNABLE TO FIND, Vumerity (Titration pack--231mg  bid for 7 days, then 231mg , 2 tablets bid for 23 days),  Maintenance dose of 231mg , 2  tablets bid., Disp: , Rfl:    PAST MEDICAL HISTORY: Past Medical History:  Diagnosis Date  . Depression   . MS (multiple sclerosis) ([place])    sees Dr Kerman Passey in [facility]    PAST SURGICAL HISTORY: Past Surgical History:  Procedure Laterality Date  . LIPOSUCTION      FAMILY HISTORY: Family History  Problem Relation Age of Onset  . Drug abuse Mother        opiod addiction  . Arthritis Mother        back pain  . Arthritis Father   . Arthritis/Rheumatoid Father   . Ankylosing spondylitis Father   . Seizures Maternal Aunt   . Parkinsonism Maternal Grandfather   . Diabetes Maternal Grandfather   . Lupus Maternal Aunt   . Mental illness Brother        bipolar disorder  . Parkinson's disease Paternal Grandmother   . Arthritis Sister   . Mental illness Sister   . Drug abuse Sister     SOCIAL HISTORY:  Social History   Socioeconomic History  . Marital status: Married    Spouse name: Jeneen Rinks  . Number of children: 1  . Years of education: 19  . Highest education level: Not on file  Occupational History  . Occupation: Education officer, museum  Tobacco Use  . Smoking status: Never Smoker  . Smokeless tobacco: Never Used  Substance and Sexual Activity  . Alcohol use: Yes    Alcohol/week: 1.0 standard drinks    Types: 1 Standard drinks or equivalent per week  . Drug use: No  . Sexual activity: Yes    Partners: Male    Birth control/protection: Surgical  Other Topics Concern  . Not on file  Social History Narrative   Lives with husband and son, works as Education officer, museum, no dietary restrictions, exercises intermittently. Wears a seat belt regularly   Social Determinants of Health   Financial Resource Strain:   . Difficulty of Paying Living Expenses: Not on file  Food Insecurity:   . Worried About Charity fundraiser in the Last Year: Not on file  . Ran Out of Food in the Last Year: Not on file  Transportation Needs:   . Lack of Transportation (Medical): Not on file  . Lack of  Transportation (Non-Medical): Not on file  Physical Activity:   . Days of Exercise per Week: Not on file  . Minutes of Exercise per Session: Not on file  Stress:   . Feeling of Stress : Not on file  Social Connections:   . Frequency of Communication with Friends and Family: Not on file  . Frequency of Social Gatherings with Friends and Family: Not on file  . Attends Religious Services: Not on file  . Active Member of Clubs or Organizations: Not on file  . Attends Archivist Meetings: Not on file  . Marital Status: Not on file  Intimate Partner Violence:   . Fear of Current or Ex-Partner: Not on file  . Emotionally Abused: Not on file  .  Physically Abused: Not on file  . Sexually Abused: Not on file     PHYSICAL EXAM  Vitals:   11/18/19 1540  BP: 130/72  Pulse: 91  Weight: 154 lb (69.9 kg)  Height: 5\' 4"  (1.626 m)    Body mass index is 26.43 kg/m.   General: The patient is well-developed and well-nourished and in no acute distress  Neurologic Exam  Mental status: The patient is alert and oriented x 3 at the time of the examination. The patient has apparent normal recent and remote memory, with an apparently normal attention span and concentration ability.   Speech is normal.  Cranial nerves: Extraocular movements are full.  Facial strength is normal.  Trapezius strength is normal.  No dysarthria is noted.    No obvious hearing deficits are noted.  Motor:  There are no fasciculations.  Muscle bulk, tone and strength are normal. Sensory: There is intact sensation to touch and vibration in the arms and legs.  Coordination: Cerebellar testing reveals good finger-nose-finger and heel-to-shin bilaterally.  Gait and station: Station and gait are normal.    Reflexes: Deep tendon reflexes are mildly increased in the legs.. No ankle clonus.  _____________________________________________  MS (multiple sclerosis) ([place]) - Plan: CBC with Differential/Platelets  High  risk medication use - Plan: CBC with Differential/Platelets  Other fatigue   1.   Continue Vumerity for now.  We will recheck a CBC with differential in about a month.  If the lymphocyte counts are not within the normal range at that time we will need to consider a different disease modifying therapy.  We discussed this and she would consider Aubagio. 2.   Stay active and exercise as tolerated. 3.    Continue Adderall.  She does not need a refill at this point. 4.   Return in 6 months or sooner if there are new or worsening neurologic symptoms.  Mohan Erven A. Felecia Shelling, MD, PhD [id number], [id number] PM Certified in Neurology, Clinical Neurophysiology, Sleep Medicine, Pain Medicine and Neuroimaging  [facility] [address], [place], [place] [address] [phone]

## 2019-12-14 ENCOUNTER — Telehealth: Payer: Self-pay | Admitting: *Deleted

## 2019-12-14 NOTE — Telephone Encounter (Signed)
Received fax from [employer] that [place] approved 12/14/19-12/12/20 for 12 fills. PA ref# [id number].

## 2019-12-22 ENCOUNTER — Other Ambulatory Visit: Payer: Self-pay

## 2019-12-22 ENCOUNTER — Other Ambulatory Visit (INDEPENDENT_AMBULATORY_CARE_PROVIDER_SITE_OTHER): Payer: Self-pay

## 2019-12-22 DIAGNOSIS — Z0289 Encounter for other administrative examinations: Secondary | ICD-10-CM

## 2019-12-22 DIAGNOSIS — Z79899 Other long term (current) drug therapy: Secondary | ICD-10-CM

## 2019-12-22 DIAGNOSIS — G35 Multiple sclerosis: Secondary | ICD-10-CM

## 2019-12-23 LAB — CBC WITH DIFFERENTIAL/PLATELET
Basophils Absolute: 0 10*3/uL (ref 0.0–0.2)
Basos: 0 %
EOS (ABSOLUTE): 0.1 10*3/uL (ref 0.0–0.4)
Eos: 2 %
Hematocrit: 39.1 % (ref 34.0–46.6)
Hemoglobin: 13.2 g/dL (ref 11.1–15.9)
Immature Grans (Abs): 0 10*3/uL (ref 0.0–0.1)
Immature Granulocytes: 0 %
Lymphocytes Absolute: 0.7 10*3/uL (ref 0.7–3.1)
Lymphs: 14 %
MCH: 33.6 pg — ABNORMAL HIGH (ref 26.6–33.0)
MCHC: 33.8 g/dL (ref 31.5–35.7)
MCV: 100 fL — ABNORMAL HIGH (ref 79–97)
Monocytes Absolute: 0.3 10*3/uL (ref 0.1–0.9)
Monocytes: 6 %
Neutrophils Absolute: 3.6 10*3/uL (ref 1.4–7.0)
Neutrophils: 78 %
Platelets: 257 10*3/uL (ref 150–450)
RBC: 3.93 x10E6/uL (ref 3.77–5.28)
RDW: 13.1 % (ref 11.7–15.4)
WBC: 4.6 10*3/uL (ref 3.4–10.8)

## 2020-01-07 ENCOUNTER — Other Ambulatory Visit: Payer: Self-pay | Admitting: *Deleted

## 2020-01-07 MED ORDER — AMPHETAMINE-DEXTROAMPHETAMINE 10 MG PO TABS: ORAL_TABLET | ORAL | 0 refills | Status: DC

## 2020-01-07 MED FILL — AMPHETAMINE-DEXTROAMPHETAMI: 10 | 30 days supply | Qty: 90 | Fill #0

## 2020-02-16 ENCOUNTER — Ambulatory Visit (INDEPENDENT_AMBULATORY_CARE_PROVIDER_SITE_OTHER): Payer: 59 | Admitting: Neurology

## 2020-02-16 ENCOUNTER — Encounter: Payer: Self-pay | Admitting: Neurology

## 2020-02-16 ENCOUNTER — Other Ambulatory Visit: Payer: Self-pay

## 2020-02-16 VITALS — BP 121/78 | HR 102 | Temp 97.4°F | Ht 64.0 in | Wt 152.5 lb

## 2020-02-16 DIAGNOSIS — R5383 Other fatigue: Secondary | ICD-10-CM

## 2020-02-16 DIAGNOSIS — Z79899 Other long term (current) drug therapy: Secondary | ICD-10-CM

## 2020-02-16 DIAGNOSIS — F419 Anxiety disorder, unspecified: Secondary | ICD-10-CM

## 2020-02-16 DIAGNOSIS — M5481 Occipital neuralgia: Secondary | ICD-10-CM | POA: Diagnosis not present

## 2020-02-16 DIAGNOSIS — G35 Multiple sclerosis: Secondary | ICD-10-CM

## 2020-02-16 NOTE — Progress Notes (Signed)
[facility]  PATIENT: Laurie Cole DOB: 1976/03/22  REFERRING CLINICIAN: PCP is Penni Homans REASON FOR VISIT: MS   HISTORICAL  CHIEF COMPLAINT:  Chief Complaint  Patient presents with  . Follow-up    Rm 12, alone. Last seen 11/18/19. Here to discuss pain she is having in skull area on right side. Has improved some, has dull pain. Feels it might be related to tension in neck. Seeing about 9 pt/day. She has heavier work load than normal.   . Multiple Sclerosis    On Vumerity. Takes adderall for fatigue.     HISTORY OF PRESENT ILLNESS:  Laurie Cole is a 45 y.o. woman with MS diagnosed in 2006.     Update 02/16/2020: She is generally doing well with MS.    However, she has dysesthesia in the right occiput, worse with some positions of her neck and when she lays on that part of her head (far forward or back).    She has a recliner chair and was laying in it when pain started last week.  Pain was throbbing for a few days and now is more dull.   She has allodynia over the distribution to touch.    Her MS has been stable.  She notes no new numbness, weakness, ataxia or visual symptoms.  She has some fatigue and attention deficit, both helped with Adderall.  She sleeps well most nights.  She feels her mood is doing well.  When she switched from Tecfidera to [place], she had a reduction in her lymphocyte count.  The dose was changed from 2 pills twice a day to 1 pill in the morning and 2 pills at night.  Follow-up lymphocyte count was still 0.7 and we discussed rechecking today.  Update 11/18/2019:  She feels she is doing well with her MS and has no new symptoms. She has tolerated oit better than Tecfidera with less flushing and GI symptoms.  Due to low lymphocytes of 0.7, we reduced Vumerity to 231 in am and 462 in pm.   Lymphocytes dropped a little further to 0.6 and we discussed checking again in about a month  We discussed options if the lymphocytes do not  recover by next month.    She is JCV high positive and is concerned about PML risks.    She continues to do well with her MS.  Gait is normal and she is physically active.  Strength and muscle tone and sensation normal.  Bladder function and vision are doing well.  She has some fatigue improved with Adderall.  She sleeps well most nights.  Mood is doing well.  Update 05/19/2019 (virtual) She feels her MS is stable.  She switch from Tecfidera to [place].  She is tolerating it better with less flushing.  She is not having any GI issues.  She denies any recent exacerbations.  She has not noted any progression of symptoms.  Her gait is doing well.  She remains physically active.  She denies any major problems with balance.  Strength, muscle tone and sensation is doing well.  She denies any significant problems with bladder function.  Vision is doing well.  She does note some difficulties with fatigue.  That is helped by the Adderall.    She is sleeping well most nights.  She denies any depression but has some anxiety.  Prozac has helped.  She is experiencing less anxiety and was able to cut the dose down to 10 mg.  Cognition is  doing well.  Work is going well.       Update 11/13/2018: She is doing well and has no recent exacerbation or new MS symptoms.  She still is having some side effects on Tecfidera, flushing greater than GI.  Her last MRI did not show any new MS lesions.  We will need to repeat another 1 to make sure there is not subclinical progression.  Her gait is fine and she has no trouble climbing stairs or doing other activities.  She denies any  weakness in the legs or any fixed numbness.  The numbness that she was experiencing earlier this year has resolved.  She notes some fatigue that is helped by Adderall.  She also has some issues with attention deficit helped by Adderall.  Cognition is otherwise fine.  She sleeps well most nights.   Mood is stable.  Depression is doing well.  She has some  anxiety but it is generally doing well.  She has a long history of IBS-C and is on Linzess.     Update 06/18/2018: She is noting more numbness, in the face and both arms and legs.    Symptoms started on Friday and are mostly mild.    She notes that the symptoms are symmetric and in her face more than in the arms.    She did have a headache the first day and spent much of the weekend outdoors.    She actually felt better over the weekend but the numbness returned today.     MRI of the brain 01/26/2018 showed a stable pattern of white matter foci in the cerbral hemispheres with one right cerebellar focus, c/w MS.   There were no new lesions compared to 12/30/2017     She is on Tecfidera and she tolerates it well.  She is noting more anxiety and has had more stress at work.  She is noting more fatigue,  Worse later in the day than in the morning.   She has some heat sensitivity.     She is sleeping well.   Adderall has helped her fatigue.      Update 04/17/2018: She is on Tecfidera and tolerates it well most days.  Some days, she still will have some flushing even when she takes after a meal..    Her last MRI was performed 01/25/2018 and showed no new lesions.  Images were personally reviewed.  She feels her gait is good but may drag the left leg a little bit if she walks rapidly before she is warmed up.     She remains active and exercises regularly.   She has lost a couple pounds.     Vision is normal.    She has had occasional stress incontinence with sneeze/laugh but not otherwise.     Her fatigue is doing well.    She is sleeping well.    Adderall has helped her fatigue and her mild attentional issues.  Mood is doing well.  She notes occasional fasciculations but these have not worsened any..   She is on Lumify for her red eye.     Update 10/17/2017:   She is on Tecfidera for her MS. She tolerates it well. She has not had any exacerbations. He now and then she will have some flushing. Lymphocytes  are checked on a regular basis and they have been in the normal range. The MRI of the brain 12/29/2016 showed white matter foci consistent with her diagnosis of MS but there  were no changes when compared to the previous MRI. She denies any difficulty with her gait. There is no numbness, weakness or change in her vision. Bladder is fine. Her fatigue is doing fairly well. Adderall helps. She is concerned about some recent weight gain and is going to try to eat better and exercise more. Of note, she eats more in the morning than she normally would as it reduces the flushing with the Tecfidera. She has tried some days moving her first dose of Tecfidera to after lunch and does better. Mood and cognition are doing well.                                                           From 01/03/2017:   MS:  For MS is stable. She has no new exacerbations. She has been on Tecfidera since 2013 and she tolerates it reasonably well but continues to have some flushing    Lymphocytes have been checked every 6 months and have been in the normal range.   Flushing is better with moving the timing of her pills and taking aspirin.      Her MRI of the brain 12/29/2016 with and without contrast shows T2/FLAIR hyperintense foci in a pattern and configuration consistent with chronic demyelinating plaques of MS. There is no interval change (compared to 12/2015) and there are no acute findings on the current MRI.         Muscle fasciculations:  She has occasional muscle fasciculations, worse when tired.  Her EMG/NCV was normal.      She denies any significant issues with gait, numbness, weakness or vision. She did have numbness in the past at the time of diagnosis.  She has no bladder urgency or frequency.  Fatigue/sleep:  Her fatigue is doing better since she is exercising more     She has some heat intolerance with more fatigue. As needed, she takes Adderall 10 mg by mouth twice a day with benefit. She denies any side effects.  She  sleeps well most nights.  Mood:   She has been on Prozac 20 mg and tolerates it well.   She feels mood is doing very well.    She denies any significant cognitive issues.    Vit D:   She is on vit D supplements of a mild deficiency.  MS History:  who presented with a [place] sign, visual changes and numbness in 2006. MRIs were performed that were consistent with multiple sclerosis. Additionally, visual evoked potentials showed slowing of the P100. She was placed on Betaseron. She stopped when she was pregnant about 2 years later. She returned to Betaseron for about another year and one half after her son was born. Then she had some breakthrough relapses and she was switched to Tysabri and tolerated it very well. Unfortunately, after a couple years she converted from negative to positive at a high titer.   She does not have any exacerbations while on Tysabri. She then switched to Tecfidera. She's been on Tecfidera about 3 years now and has tolerated it well. She has not had any exacerbations while on it. MRIs have been stable.    REVIEW OF SYSTEMS:  Constitutional: No fevers, chills, sweats, or change in appetite.  Notes  Fatigue Eyes: No visual changes, double vision, eye pain  Ear, nose and throat: No hearing loss, ear pain, nasal congestion, sore throat Cardiovascular: No chest pain, palpitations Respiratory:  No shortness of breath at rest or with exertion.   No wheezes GastrointestinaI: No nausea, vomiting, diarrhea, abdominal pain, fecal incontinence Genitourinary:  No dysuria, urinary retention or frequency.  No nocturia. Musculoskeletal:  No neck pain, back pain.   Reports a muscle spasm sensation in the right hip. Integumentary: No rash, pruritus, skin lesions Neurological: as above Psychiatric: Notes  depression at this time.  No anxiety Endocrine: No palpitations, diaphoresis, change in appetite, change in weigh or increased thirst Hematologic/Lymphatic:  No anemia, purpura,  petechiae. Allergic/Immunologic: No itchy/runny eyes, nasal congestion, recent allergic reactions, rashes  ALLERGIES: No Known Allergies  HOME MEDICATIONS:  Current Outpatient Medications:  .  amphetamine-dextroamphetamine (ADDERALL) 10 MG tablet, Take one tablet 3 times daily as needed., Disp: 90 tablet, Rfl: 0 .  Biotin 10000 MCG TABS, Take by mouth., Disp: , Rfl:  .  cholecalciferol (VITAMIN D) 1000 units tablet, Take 5,000 Units by mouth daily., Disp: , Rfl:  .  Diroximel Fumarate (VUMERITY) 231 MG CPDR, Take 2 capsules by mouth 2 (two) times daily. , Disp: , Rfl:  .  FLUoxetine (PROZAC) 20 MG tablet, Take 1/2 to 1 TABLET daily, Disp: 90 tablet, Rfl: 3 .  meclizine (ANTIVERT) 25 MG tablet, Take 1 tablet (25 mg total) by mouth 3 (three) times daily as needed for dizziness., Disp: 90 tablet, Rfl: 1 .  UNABLE TO FIND, Vumerity (Titration pack--231mg  bid for 7 days, then 231mg , 2 tablets bid for 23 days),  Maintenance dose of 231mg , 2 tablets bid., Disp: , Rfl:    PAST MEDICAL HISTORY: Past Medical History:  Diagnosis Date  . Depression   . MS (multiple sclerosis) ([place])    sees Dr Kerman Passey in [facility]    PAST SURGICAL HISTORY: Past Surgical History:  Procedure Laterality Date  . LIPOSUCTION      FAMILY HISTORY: Family History  Problem Relation Age of Onset  . Drug abuse Mother        opiod addiction  . Arthritis Mother        back pain  . Arthritis Father   . Arthritis/Rheumatoid Father   . Ankylosing spondylitis Father   . Seizures Maternal Aunt   . Parkinsonism Maternal Grandfather   . Diabetes Maternal Grandfather   . Lupus Maternal Aunt   . Mental illness Brother        bipolar disorder  . Parkinson's disease Paternal Grandmother   . Arthritis Sister   . Mental illness Sister   . Drug abuse Sister     SOCIAL HISTORY:  Social History   Socioeconomic History  . Marital status: Married    Spouse name: Jeneen Rinks  . Number of children: 1  . Years of education: 63  .  Highest education level: Not on file  Occupational History  . Occupation: Education officer, museum  Tobacco Use  . Smoking status: Never Smoker  . Smokeless tobacco: Never Used  Substance and Sexual Activity  . Alcohol use: Yes    Alcohol/week: 1.0 standard drinks    Types: 1 Standard drinks or equivalent per week  . Drug use: No  . Sexual activity: Yes    Partners: Male    Birth control/protection: Surgical  Other Topics Concern  . Not on file  Social History Narrative   Lives with husband and son, works as Education officer, museum, no dietary restrictions, exercises intermittently. Wears a seat belt regularly  Social Determinants of Health   Financial Resource Strain:   . Difficulty of Paying Living Expenses:   Food Insecurity:   . Worried About Charity fundraiser in the Last Year:   . Arboriculturist in the Last Year:   Transportation Needs:   . Film/video editor (Medical):   Marland Kitchen Lack of Transportation (Non-Medical):   Physical Activity:   . Days of Exercise per Week:   . Minutes of Exercise per Session:   Stress:   . Feeling of Stress :   Social Connections:   . Frequency of Communication with Friends and Family:   . Frequency of Social Gatherings with Friends and Family:   . Attends Religious Services:   . Active Member of Clubs or Organizations:   . Attends Archivist Meetings:   Marland Kitchen Marital Status:   Intimate Partner Violence:   . Fear of Current or Ex-Partner:   . Emotionally Abused:   Marland Kitchen Physically Abused:   . Sexually Abused:      PHYSICAL EXAM  Vitals:   02/16/20 1043  BP: 121/78  Pulse: (!) 102  Temp: (!) 97.4 F (36.3 C)  SpO2: 98%  Weight: 152 lb 8 oz (69.2 kg)  Height: 5\' 4"  (1.626 m)    Body mass index is 26.18 kg/m.   General: The patient is well-developed and well-nourished and in no acute distress.  Neck: She has mild tenderness over the right occiput.  Neurologic Exam  Mental status: The patient is alert and oriented x 3 at the time of  the examination. The patient has apparent normal recent and remote memory, with an apparently normal attention span and concentration ability.   Speech is normal.  Cranial nerves: Extraocular movements are full.  Facial strength is normal.  Trapezius strength is normal.  No dysarthria is noted.    No obvious hearing deficits are noted.  Motor:  There are no fasciculations.  Muscle bulk, tone and strength are normal.  Sensory: Allodynia over the right occiput.  There is intact sensation to touch and vibration in the arms and legs.  Coordination: Cerebellar testing reveals good finger-nose-finger and heel-to-shin bilaterally.  Gait and station: Station and gait are normal.    Reflexes: Deep tendon reflexes are mildly increased in the legs.. No ankle clonus.  _____________________________________________  Multiple sclerosis ([place]) - Plan: MR BRAIN WO CONTRAST, MR CERVICAL SPINE WO CONTRAST, CBC with Differential/Platelet  High risk medication use - Plan: CBC with Differential/Platelet  Other fatigue  Anxiety  Occipital neuralgia of right side  1.   Continue Vumerity for now.  Check CBC with differential today to determine if the lymphocyte count is still borderline.  If it does not improve, we could consider  A[place]agio.  We also need to check MRI of the brain and cervical spine to determine if there has been any subclinical progression.  If this is occurring we would need to consider a different disease modifying therapy. 2.   Stay active and exercise as tolerated. 3.    Continue Adderall.  She does not need a refill at this point. 4.   Return in 6 months or sooner if there are new or worsening neurologic symptoms.  Geneieve Duell A. Felecia Shelling, MD, PhD [id number], [id number] PM Certified in Neurology, Clinical Neurophysiology, Sleep Medicine, Pain Medicine and Neuroimaging  [facility] [address] [place], [place] [address] [phone]

## 2020-02-17 LAB — CBC WITH DIFFERENTIAL/PLATELET
Basophils Absolute: 0 10*3/uL (ref 0.0–0.2)
Basos: 1 %
EOS (ABSOLUTE): 0.1 10*3/uL (ref 0.0–0.4)
Eos: 1 %
Hematocrit: 38.7 % (ref 34.0–46.6)
Hemoglobin: 13.2 g/dL (ref 11.1–15.9)
Immature Grans (Abs): 0 10*3/uL (ref 0.0–0.1)
Immature Granulocytes: 0 %
Lymphocytes Absolute: 0.6 10*3/uL — ABNORMAL LOW (ref 0.7–3.1)
Lymphs: 14 %
MCH: 33.3 pg — ABNORMAL HIGH (ref 26.6–33.0)
MCHC: 34.1 g/dL (ref 31.5–35.7)
MCV: 98 fL — ABNORMAL HIGH (ref 79–97)
Monocytes Absolute: 0.3 10*3/uL (ref 0.1–0.9)
Monocytes: 7 %
Neutrophils Absolute: 3.3 10*3/uL (ref 1.4–7.0)
Neutrophils: 77 %
Platelets: 223 10*3/uL (ref 150–450)
RBC: 3.96 x10E6/uL (ref 3.77–5.28)
RDW: 13 % (ref 11.7–15.4)
WBC: 4.3 10*3/uL (ref 3.4–10.8)

## 2020-02-18 ENCOUNTER — Other Ambulatory Visit: Payer: Self-pay | Admitting: Neurology

## 2020-02-18 DIAGNOSIS — Z79899 Other long term (current) drug therapy: Secondary | ICD-10-CM

## 2020-02-18 DIAGNOSIS — G35 Multiple sclerosis: Secondary | ICD-10-CM

## 2020-02-18 NOTE — Progress Notes (Signed)
cbc

## 2020-02-23 ENCOUNTER — Telehealth: Payer: Self-pay | Admitting: Neurology

## 2020-02-23 NOTE — Telephone Encounter (Signed)
[facility] UMR order sent to GI. No auth they will reach out to the patient to schedule.  

## 2020-02-29 ENCOUNTER — Other Ambulatory Visit: Payer: Self-pay | Admitting: *Deleted

## 2020-02-29 MED ORDER — AMPHETAMINE-DEXTROAMPHETAMINE 10 MG PO TABS: ORAL_TABLET | ORAL | 0 refills | Status: DC

## 2020-02-29 MED ORDER — FLUOXETINE HCL 20 MG PO TABS: ORAL_TABLET | ORAL | 3 refills | Status: DC

## 2020-02-29 MED FILL — FLUOXETINE HCL 20 MG TABS: 20 | 90 days supply | Qty: 90 | Fill #0

## 2020-03-01 ENCOUNTER — Telehealth: Payer: Self-pay | Admitting: *Deleted

## 2020-03-01 NOTE — Telephone Encounter (Signed)
Submitted PA Adderall 10mg  on [facility] 02/29/20. [id number]. Waiting on determination from [place].  Pt ID: [id number].

## 2020-03-02 MED FILL — AMPHETAMINE-DEXTROAMPHETAMI: 10 | 30 days supply | Qty: 90 | Fill #0

## 2020-03-02 NOTE — Telephone Encounter (Signed)
Received fax from [place] that [place] approved 03/01/2020-02/28/2021. PA ref number: [id number].

## 2020-04-07 ENCOUNTER — Other Ambulatory Visit: Payer: Self-pay

## 2020-04-07 MED ORDER — AMPHETAMINE-DEXTROAMPHETAMINE 10 MG PO TABS: ORAL_TABLET | ORAL | 0 refills | Status: DC

## 2020-04-07 MED FILL — AMPHETAMINE-DEXTROAMPHETAMI: 10 | 30 days supply | Qty: 90 | Fill #0

## 2020-05-18 ENCOUNTER — Ambulatory Visit (INDEPENDENT_AMBULATORY_CARE_PROVIDER_SITE_OTHER): Payer: 59 | Admitting: Neurology

## 2020-05-18 ENCOUNTER — Encounter: Payer: Self-pay | Admitting: Neurology

## 2020-05-18 VITALS — BP 109/71 | HR 87 | Ht 63.25 in | Wt 155.3 lb

## 2020-05-18 DIAGNOSIS — G35 Multiple sclerosis: Secondary | ICD-10-CM | POA: Diagnosis not present

## 2020-05-18 DIAGNOSIS — Z79899 Other long term (current) drug therapy: Secondary | ICD-10-CM

## 2020-05-18 MED ORDER — AMPHETAMINE-DEXTROAMPHETAMINE 10 MG PO TABS: ORAL_TABLET | ORAL | 0 refills | Status: DC

## 2020-05-18 MED FILL — AMPHETAMINE-DEXTROAMPHETAMI: 10 | 30 days supply | Qty: 90 | Fill #0

## 2020-05-18 NOTE — Progress Notes (Signed)
[facility]  PATIENT: Laurie Cole DOB: [birth year]  REFERRING CLINICIAN: PCP is Penni Homans REASON FOR VISIT: MS   HISTORICAL  CHIEF COMPLAINT:  Chief Complaint  Patient presents with  . Follow-up    Rm 12 alone here for 6 month f/u reports Vumerity has been working well for would like a refill on Adderall    HISTORY OF PRESENT ILLNESS:  Laurie Cole is a 44 y.o. woman with relapsing remitting MS diagnosed in 2006.     Update 05/18/2020: She is taking Vumerity 231 mg po bid as higher dose was associated with a lower lymphocyte count (even 231-462 mg). We discussed that if lymphocyte count remains low on the 1/2 dose that we will need to consider a different DMT.     She tolerates it well with no GI issues.     She is doing well with her MS and has no new symptoms.   No recent exacerbation.   MRIs have been stable (last one 11/24/2018).   We reviewed her images.   Brain volume is good.   Mostly periventricular with just a couple juxtacortical foci.   Gait, strength, sensation are doing well.   Fatigue and Attention deficit have done well on Adderall.  Update 02/16/2020: She is generally doing well with MS.    However, she has dysesthesia in the right occiput, worse with some positions of her neck and when she lays on that part of her head (far forward or back).    She has a recliner chair and was laying in it when pain started last week.  Pain was throbbing for a few days and now is more dull.   She has allodynia over the distribution to touch.    Her MS has been stable.  She notes no new numbness, weakness, ataxia or visual symptoms.  She has some fatigue and attention deficit, both helped with Adderall.  She sleeps well most nights.  She feels her mood is doing well.  When she switched from Tecfidera to [place], she had a reduction in her lymphocyte count.  The dose was changed from 2 pills twice a day to 1 pill in the morning and 2 pills at night.   Follow-up lymphocyte count was still 0.7 and we discussed rechecking today.  Update 11/18/2019:  She feels she is doing well with her MS and has no new symptoms. She has tolerated oit better than Tecfidera with less flushing and GI symptoms.  Due to low lymphocytes of 0.7, we reduced Vumerity to 231 in am and 462 in pm.   Lymphocytes dropped a little further to 0.6 and we discussed checking again in about a month  We discussed options if the lymphocytes do not recover by next month.    She is JCV high positive and is concerned about PML risks.    She continues to do well with her MS.  Gait is normal and she is physically active.  Strength and muscle tone and sensation normal.  Bladder function and vision are doing well.  She has some fatigue improved with Adderall.  She sleeps well most nights.  Mood is doing well.  Update 05/19/2019 (virtual) She feels her MS is stable.  She switch from Tecfidera to [place].  She is tolerating it better with less flushing.  She is not having any GI issues.  She denies any recent exacerbations.  She has not noted any progression of symptoms.  Her gait is doing well.  She  remains physically active.  She denies any major problems with balance.  Strength, muscle tone and sensation is doing well.  She denies any significant problems with bladder function.  Vision is doing well.  She does note some difficulties with fatigue.  That is helped by the Adderall.    She is sleeping well most nights.  She denies any depression but has some anxiety.  Prozac has helped.  She is experiencing less anxiety and was able to cut the dose down to 10 mg.  Cognition is doing well.  Work is going well.       Update 11/13/2018: She is doing well and has no recent exacerbation or new MS symptoms.  She still is having some side effects on Tecfidera, flushing greater than GI.  Her last MRI did not show any new MS lesions.  We will need to repeat another 1 to make sure there is not subclinical  progression.  Her gait is fine and she has no trouble climbing stairs or doing other activities.  She denies any  weakness in the legs or any fixed numbness.  The numbness that she was experiencing earlier this year has resolved.  She notes some fatigue that is helped by Adderall.  She also has some issues with attention deficit helped by Adderall.  Cognition is otherwise fine.  She sleeps well most nights.   Mood is stable.  Depression is doing well.  She has some anxiety but it is generally doing well.  She has a long history of IBS-C and is on Linzess.     Update 06/18/2018: She is noting more numbness, in the face and both arms and legs.    Symptoms started on Friday and are mostly mild.    She notes that the symptoms are symmetric and in her face more than in the arms.    She did have a headache the first day and spent much of the weekend outdoors.    She actually felt better over the weekend but the numbness returned today.     MRI of the brain 01/26/2018 showed a stable pattern of white matter foci in the cerbral hemispheres with one right cerebellar focus, c/w MS.   There were no new lesions compared to 12/30/2017     She is on Tecfidera and she tolerates it well.  She is noting more anxiety and has had more stress at work.  She is noting more fatigue,  Worse later in the day than in the morning.   She has some heat sensitivity.     She is sleeping well.   Adderall has helped her fatigue.      Update 04/17/2018: She is on Tecfidera and tolerates it well most days.  Some days, she still will have some flushing even when she takes after a meal..    Her last MRI was performed 01/25/2018 and showed no new lesions.  Images were personally reviewed.  She feels her gait is good but may drag the left leg a little bit if she walks rapidly before she is warmed up.     She remains active and exercises regularly.   She has lost a couple pounds.     Vision is normal.    She has had occasional stress incontinence  with sneeze/laugh but not otherwise.     Her fatigue is doing well.    She is sleeping well.    Adderall has helped her fatigue and her mild attentional issues.  Mood  is doing well.  She notes occasional fasciculations but these have not worsened any..   She is on Lumify for her red eye.     Update 10/17/2017:   She is on Tecfidera for her MS. She tolerates it well. She has not had any exacerbations. He now and then she will have some flushing. Lymphocytes are checked on a regular basis and they have been in the normal range. The MRI of the brain 12/29/2016 showed white matter foci consistent with her diagnosis of MS but there were no changes when compared to the previous MRI. She denies any difficulty with her gait. There is no numbness, weakness or change in her vision. Bladder is fine. Her fatigue is doing fairly well. Adderall helps. She is concerned about some recent weight gain and is going to try to eat better and exercise more. Of note, she eats more in the morning than she normally would as it reduces the flushing with the Tecfidera. She has tried some days moving her first dose of Tecfidera to after lunch and does better. Mood and cognition are doing well.                                                           From 01/03/2017:   MS:  For MS is stable. She has no new exacerbations. She has been on Tecfidera since 2013 and she tolerates it reasonably well but continues to have some flushing    Lymphocytes have been checked every 6 months and have been in the normal range.   Flushing is better with moving the timing of her pills and taking aspirin.      Her MRI of the brain 12/29/2016 with and without contrast shows T2/FLAIR hyperintense foci in a pattern and configuration consistent with chronic demyelinating plaques of MS. There is no interval change (compared to 12/2015) and there are no acute findings on the current MRI.         Muscle fasciculations:  She has occasional muscle fasciculations,  worse when tired.  Her EMG/NCV was normal.      She denies any significant issues with gait, numbness, weakness or vision. She did have numbness in the past at the time of diagnosis.  She has no bladder urgency or frequency.  Fatigue/sleep:  Her fatigue is doing better since she is exercising more     She has some heat intolerance with more fatigue. As needed, she takes Adderall 10 mg by mouth twice a day with benefit. She denies any side effects.  She sleeps well most nights.  Mood:   She has been on Prozac 20 mg and tolerates it well.   She feels mood is doing very well.    She denies any significant cognitive issues.    Vit D:   She is on vit D supplements of a mild deficiency.  MS History:  who presented with a [place] sign, visual changes and numbness in 2006. MRIs were performed that were consistent with multiple sclerosis. Additionally, visual evoked potentials showed slowing of the P100. She was placed on Betaseron. She stopped when she was pregnant about 2 years later. She returned to Betaseron for about another year and one half after her son was born. Then she had some breakthrough relapses and she was switched to  Tysabri and tolerated it very well. Unfortunately, after a couple years she converted from negative to positive at a high titer.   She does not have any exacerbations while on Tysabri. She then switched to Tecfidera. She's been on Tecfidera about 3 years now and has tolerated it well. She has not had any exacerbations while on it. MRIs have been stable.    REVIEW OF SYSTEMS:  Constitutional: No fevers, chills, sweats, or change in appetite.  Notes  Fatigue Eyes: No visual changes, double vision, eye pain Ear, nose and throat: No hearing loss, ear pain, nasal congestion, sore throat Cardiovascular: No chest pain, palpitations Respiratory:  No shortness of breath at rest or with exertion.   No wheezes GastrointestinaI: No nausea, vomiting, diarrhea, abdominal pain, fecal  incontinence Genitourinary:  No dysuria, urinary retention or frequency.  No nocturia. Musculoskeletal:  No neck pain, back pain.   Reports a muscle spasm sensation in the right hip. Integumentary: No rash, pruritus, skin lesions Neurological: as above Psychiatric: Notes  depression at this time.  No anxiety Endocrine: No palpitations, diaphoresis, change in appetite, change in weigh or increased thirst Hematologic/Lymphatic:  No anemia, purpura, petechiae. Allergic/Immunologic: No itchy/runny eyes, nasal congestion, recent allergic reactions, rashes  ALLERGIES: No Known Allergies  HOME MEDICATIONS:  Current Outpatient Medications:  .  amphetamine-dextroamphetamine (ADDERALL) 10 MG tablet, Take one tablet 3 times daily as needed., Disp: 90 tablet, Rfl: 0 .  Biotin 10000 MCG TABS, Take by mouth., Disp: , Rfl:  .  cholecalciferol (VITAMIN D) 1000 units tablet, Take 5,000 Units by mouth daily., Disp: , Rfl:  .  Diroximel Fumarate (VUMERITY) 231 MG CPDR, Take 2 capsules by mouth 2 (two) times daily. , Disp: , Rfl:  .  FLUoxetine (PROZAC) 20 MG tablet, Take 1/2 to 1 TABLET daily, Disp: 90 tablet, Rfl: 3   PAST MEDICAL HISTORY: Past Medical History:  Diagnosis Date  . Depression   . MS (multiple sclerosis) ([place])    sees Dr Kerman Passey in [facility]    PAST SURGICAL HISTORY: Past Surgical History:  Procedure Laterality Date  . LIPOSUCTION      FAMILY HISTORY: Family History  Problem Relation Age of Onset  . Drug abuse Mother        opiod addiction  . Arthritis Mother        back pain  . Arthritis Father   . Arthritis/Rheumatoid Father   . Ankylosing spondylitis Father   . Seizures Maternal Aunt   . Parkinsonism Maternal Grandfather   . Diabetes Maternal Grandfather   . Lupus Maternal Aunt   . Mental illness Brother        bipolar disorder  . Parkinson's disease Paternal Grandmother   . Arthritis Sister   . Mental illness Sister   . Drug abuse Sister     SOCIAL HISTORY:  Social  History   Socioeconomic History  . Marital status: Married    Spouse name: Jeneen Rinks  . Number of children: 1  . Years of education: 61  . Highest education level: Not on file  Occupational History  . Occupation: Education officer, museum  Tobacco Use  . Smoking status: Never Smoker  . Smokeless tobacco: Never Used  Substance and Sexual Activity  . Alcohol use: Yes    Alcohol/week: 1.0 standard drink    Types: 1 Standard drinks or equivalent per week  . Drug use: No  . Sexual activity: Yes    Partners: Male    Birth control/protection: Surgical  Other Topics Concern  .  Not on file  Social History Narrative   Lives with husband and son, works as Education officer, museum, no dietary restrictions, exercises intermittently. Wears a seat belt regularly   Social Determinants of Health   Financial Resource Strain:   . Difficulty of Paying Living Expenses:   Food Insecurity:   . Worried About Charity fundraiser in the Last Year:   . Arboriculturist in the Last Year:   Transportation Needs:   . Film/video editor (Medical):   Marland Kitchen Lack of Transportation (Non-Medical):   Physical Activity:   . Days of Exercise per Week:   . Minutes of Exercise per Session:   Stress:   . Feeling of Stress :   Social Connections:   . Frequency of Communication with Friends and Family:   . Frequency of Social Gatherings with Friends and Family:   . Attends Religious Services:   . Active Member of Clubs or Organizations:   . Attends Archivist Meetings:   Marland Kitchen Marital Status:   Intimate Partner Violence:   . Fear of Current or Ex-Partner:   . Emotionally Abused:   Marland Kitchen Physically Abused:   . Sexually Abused:      PHYSICAL EXAM  Vitals:   05/18/20 1603  BP: 109/71  Pulse: 87  Weight: 155 lb 5 oz (70.4 kg)  Height: 5' 3.25" (1.607 m)    Body mass index is 27.3 kg/m.   General: The patient is well-developed and well-nourished and in no acute distress.  Neck: She has mild tenderness over the right  occiput.  Neurologic Exam  Mental status: The patient is alert and oriented x 3 at the time of the examination. The patient has apparent normal recent and remote memory, with an apparently normal attention span and concentration ability.   Speech is normal.  Cranial nerves: Extraocular movements are full.  Facial strength is normal.   No dysarthria.   No obvious hearing deficits are noted.  Motor:  There are no fasciculations.  Muscle bulk, tone and strength are normal.  Sensory: Allodynia over the right occiput.  There is intact sensation to touch and vibration in the arms and legs.  Coordination: Cerebellar testing reveals good finger-nose-finger and heel-to-shin bilaterally.  Gait and station: Station and gait are normal.    Reflexes: Deep tendon reflexes are mildly increased in the legs.. No ankle clonus.  _____________________________________________  No diagnosis found.  1.   Continue Vumerity for now. We will recheck a CBC with differential. We discussed considering a switch to [place] based on the results. Later this year we will check MRI of the brain and cervical spine to determine if there has been any subclinical progression.  If this is occurring we would need to consider a different disease modifying therapy. 2.   Stay active and exercise as tolerated. 3.    Continue Adderall.  She does not need a refill at this point. 4.   Return in 6 months or sooner if there are new or worsening neurologic symptoms.  Ezekial Arns A. Felecia Shelling, MD, PhD 9/73/5329, 9:24 PM Certified in Neurology, Clinical Neurophysiology, Sleep Medicine, Pain Medicine and Neuroimaging  [facility] [address] [place], [place] [address] [phone]

## 2020-05-21 LAB — CBC WITH DIFFERENTIAL/PLATELET
Basophils Absolute: 0.1 10*3/uL (ref 0.0–0.2)
Basos: 1 %
EOS (ABSOLUTE): 0.1 10*3/uL (ref 0.0–0.4)
Eos: 2 %
Hematocrit: 36.6 % (ref 34.0–46.6)
Hemoglobin: 12.8 g/dL (ref 11.1–15.9)
Immature Grans (Abs): 0 10*3/uL (ref 0.0–0.1)
Immature Granulocytes: 0 %
Lymphocytes Absolute: 0.8 10*3/uL (ref 0.7–3.1)
Lymphs: 15 %
MCH: 33.8 pg — ABNORMAL HIGH (ref 26.6–33.0)
MCHC: 35 g/dL (ref 31.5–35.7)
MCV: 97 fL (ref 79–97)
Monocytes Absolute: 0.4 10*3/uL (ref 0.1–0.9)
Monocytes: 8 %
Neutrophils Absolute: 3.9 10*3/uL (ref 1.4–7.0)
Neutrophils: 74 %
Platelets: 243 10*3/uL (ref 150–450)
RBC: 3.79 x10E6/uL (ref 3.77–5.28)
RDW: 13.4 % (ref 11.7–15.4)
WBC: 5.2 10*3/uL (ref 3.4–10.8)

## 2020-05-21 LAB — COMPREHENSIVE METABOLIC PANEL
ALT: 9 IU/L (ref 0–32)
AST: 20 IU/L (ref 0–40)
Albumin/Globulin Ratio: 2.2 (ref 1.2–2.2)
Albumin: 4.6 g/dL (ref 3.8–4.8)
Alkaline Phosphatase: 58 IU/L (ref 48–121)
BUN/Creatinine Ratio: 18 (ref 9–23)
BUN: 14 mg/dL (ref 6–24)
Bilirubin Total: 0.9 mg/dL (ref 0.0–1.2)
CO2: 21 mmol/L (ref 20–29)
Calcium: 9.2 mg/dL (ref 8.7–10.2)
Chloride: 104 mmol/L (ref 96–106)
Creatinine, Ser: 0.76 mg/dL (ref 0.57–1.00)
GFR calc Af Amer: 113 mL/min/{1.73_m2} (ref >59)
GFR calc non Af Amer: 98 mL/min/{1.73_m2} (ref >59)
Globulin, Total: 2.1 g/dL (ref 1.5–4.5)
Glucose: 104 mg/dL — ABNORMAL HIGH (ref 65–99)
Potassium: 4.1 mmol/L (ref 3.5–5.2)
Sodium: 140 mmol/L (ref 134–144)
Total Protein: 6.7 g/dL (ref 6.0–8.5)

## 2020-05-21 LAB — QUANTIFERON-TB GOLD PLUS
QuantiFERON Mitogen Value: 0.61 IU/mL
QuantiFERON Nil Value: 0 IU/mL
QuantiFERON TB1 Ag Value: 0.04 IU/mL
QuantiFERON TB2 Ag Value: 0.03 IU/mL
QuantiFERON-TB Gold Plus: NEGATIVE

## 2020-06-20 ENCOUNTER — Encounter: Payer: Self-pay | Admitting: *Deleted

## 2020-07-12 ENCOUNTER — Other Ambulatory Visit: Payer: Self-pay | Admitting: *Deleted

## 2020-07-12 MED ORDER — AMPHETAMINE-DEXTROAMPHETAMINE 10 MG PO TABS: ORAL_TABLET | ORAL | 0 refills | Status: DC

## 2020-07-12 MED FILL — AMPHETAMINE SALTS 10 MG: 10 | 30 days supply | Qty: 90 | Fill #0

## 2020-07-12 MED FILL — FLUOXETINE HCL 20 MG TABS: 20 | 90 days supply | Qty: 90 | Fill #1

## 2020-08-17 MED FILL — IBUPROFEN 800 MG TAB: 800 | 7 days supply | Qty: 21 | Fill #0

## 2020-08-17 MED FILL — AMOXICILLIN 500 MG CAPSULE: 500 | 7 days supply | Qty: 21 | Fill #0

## 2020-08-29 ENCOUNTER — Other Ambulatory Visit: Payer: Self-pay | Admitting: *Deleted

## 2020-08-29 MED ORDER — AMPHETAMINE-DEXTROAMPHETAMINE 10 MG PO TABS: ORAL_TABLET | ORAL | 0 refills | Status: DC

## 2020-08-29 MED FILL — AMPHETAMINE SALTS 10 MG: 10 | 30 days supply | Qty: 90 | Fill #0

## 2020-08-29 NOTE — Progress Notes (Signed)
Meds ordered this encounter  Medications  . amphetamine-dextroamphetamine (ADDERALL) 10 MG tablet    Sig: Take one tablet 3 times daily as needed.    Dispense:  90 tablet    Refill:  0    Dr. Felecia Shelling out of office. Dr. Krista Blue covering MD.

## 2020-09-12 ENCOUNTER — Other Ambulatory Visit: Payer: Self-pay | Admitting: Family Medicine

## 2020-09-12 ENCOUNTER — Encounter: Payer: Self-pay | Admitting: Family Medicine

## 2020-09-12 MED ORDER — CEPHALEXIN 500 MG PO CAPS: 500.0000 mg | ORAL_CAPSULE | Freq: Three times a day (TID) | ORAL | 0 refills | Status: DC

## 2020-09-13 MED FILL — CEPHALEXIN 500 MG CAPSULE: 500 | 30 days supply | Qty: 30 | Fill #0

## 2020-09-13 NOTE — Telephone Encounter (Signed)
Spoke with pt she is taking medication and zyrtec and Sx have diminished and is feeling better. Is thankful for Rx

## 2020-10-13 ENCOUNTER — Other Ambulatory Visit: Payer: Self-pay | Admitting: *Deleted

## 2020-10-13 MED ORDER — AMPHETAMINE-DEXTROAMPHETAMINE 10 MG PO TABS: ORAL_TABLET | ORAL | 0 refills | Status: DC

## 2020-10-13 MED FILL — AMPHETAMINE SALTS 10 MG: 10 | 30 days supply | Qty: 90 | Fill #0

## 2020-11-09 ENCOUNTER — Ambulatory Visit (INDEPENDENT_AMBULATORY_CARE_PROVIDER_SITE_OTHER): Payer: 59 | Admitting: Neurology

## 2020-11-09 ENCOUNTER — Encounter: Payer: Self-pay | Admitting: Neurology

## 2020-11-09 ENCOUNTER — Other Ambulatory Visit: Payer: Self-pay

## 2020-11-09 VITALS — BP 143/88 | HR 92 | Ht 63.25 in | Wt 162.0 lb

## 2020-11-09 DIAGNOSIS — E559 Vitamin D deficiency, unspecified: Secondary | ICD-10-CM | POA: Insufficient documentation

## 2020-11-09 DIAGNOSIS — D72810 Lymphocytopenia: Secondary | ICD-10-CM | POA: Diagnosis not present

## 2020-11-09 DIAGNOSIS — G35 Multiple sclerosis: Secondary | ICD-10-CM | POA: Diagnosis not present

## 2020-11-09 DIAGNOSIS — Z79899 Other long term (current) drug therapy: Secondary | ICD-10-CM | POA: Diagnosis not present

## 2020-11-09 DIAGNOSIS — R5383 Other fatigue: Secondary | ICD-10-CM | POA: Diagnosis not present

## 2020-11-09 NOTE — Progress Notes (Signed)
[facility]  PATIENT: Laurie Cole DOB: [birth year]  REFERRING CLINICIAN: PCP is Penni Homans REASON FOR VISIT: MS   HISTORICAL  CHIEF COMPLAINT:  Chief Complaint  Patient presents with  . Follow-up    RM 12, alone. Last seen 05/18/20. Doing well, no new sx.  . Multiple Sclerosis    On Vumerity.     HISTORY OF PRESENT ILLNESS:  Laurie Cole is a 44 y.o. woman with relapsing remitting MS diagnosed in 2006.     Update 11/09/2020: She is taking Vumerity 231 mg po bid.  With higher dose (standard 462 bid or 462-231 mg) she had lower lymphocyte count (even 231-462 mg). Lymphocytes were 0.8 05/18/2020.     She tolerates it well with no GI issues.      She is doing well with her MS and has no new symptoms.   No recent exacerbation.   MRIs have been stable (last one 11/24/2018).    Brain volume is good.   Mostly periventricular with just a couple juxtacortical foci.     Gait, strength, sensation are doing well.   Fatigue and Attention deficit have done well on Adderall.  She stays active but has gained a few pounds.      MS History:  She presented with a [place] sign, visual changes and numbness in 2005. MRIs were performed that were consistent with multiple sclerosis. Additionally, visual evoked potentials showed slowing of the P100. She was placed on Betaseron. She stopped when she was pregnant about 2 years later. She returned to Betaseron for about another year and one half after her son was born. Then she had some breakthrough relapses and she was switched to Tysabri and tolerated it very well. Unfortunately, after a couple years she converted from negative to positive at a high titer.   She did not have any exacerbations while on Tysabri. She then switched to Tecfidera around 2014.  Initially she had some GI issues as she had persistent flushing.  Therefore, in 2019 she switched to Vumerity and the flushing improved.  However, due to low lymphocytes the dose had  to be reduced in 2020  Imaging: MRI of the brain 11/24/2018 shows T2/flair hyperintense foci in the hemispheres, right cerebellar hemisphere and possibly the right pons in a pattern and configuration consistent with chronic demyelinating plaque associated with multiple sclerosis.  None of the foci appears to be acute and they do not enhance.  When compared to the MRI dated 01/25/2018, there is no interval change  MRI of the cervical spine 12/02/2004 showed a posterior T2 hyperintense focus adjacent to C3.  REVIEW OF SYSTEMS:  Constitutional: No fevers, chills, sweats, or change in appetite.  Notes  Fatigue Eyes: No visual changes, double vision, eye pain Ear, nose and throat: No hearing loss, ear pain, nasal congestion, sore throat Cardiovascular: No chest pain, palpitations Respiratory:  No shortness of breath at rest or with exertion.   No wheezes GastrointestinaI: No nausea, vomiting, diarrhea, abdominal pain, fecal incontinence Genitourinary:  No dysuria, urinary retention or frequency.  No nocturia. Musculoskeletal:  No neck pain, back pain.   Reports a muscle spasm sensation in the right hip. Integumentary: No rash, pruritus, skin lesions Neurological: as above Psychiatric: Notes  depression at this time.  No anxiety Endocrine: No palpitations, diaphoresis, change in appetite, change in weigh or increased thirst Hematologic/Lymphatic:  No anemia, purpura, petechiae. Allergic/Immunologic: No itchy/runny eyes, nasal congestion, recent allergic reactions, rashes  ALLERGIES: No Known Allergies  HOME  MEDICATIONS:  Current Outpatient Medications:  .  amphetamine-dextroamphetamine (ADDERALL) 10 MG tablet, Take one tablet 3 times daily as needed., Disp: 90 tablet, Rfl: 0 .  Biotin 10000 MCG TABS, Take by mouth., Disp: , Rfl:  .  cephALEXin (KEFLEX) 500 MG capsule, Take 1 capsule (500 mg total) by mouth 3 (three) times daily., Disp: 30 capsule, Rfl: 0 .  cholecalciferol (VITAMIN D) 1000  units tablet, Take 5,000 Units by mouth daily., Disp: , Rfl:  .  Diroximel Fumarate (VUMERITY) 231 MG CPDR, Take 2 capsules by mouth 2 (two) times daily. , Disp: , Rfl:  .  FLUoxetine (PROZAC) 20 MG tablet, Take 1/2 to 1 TABLET daily, Disp: 90 tablet, Rfl: 3   PAST MEDICAL HISTORY: Past Medical History:  Diagnosis Date  . Depression   . MS (multiple sclerosis) ([place])    sees Dr Kerman Passey in [facility]    PAST SURGICAL HISTORY: Past Surgical History:  Procedure Laterality Date  . LIPOSUCTION      FAMILY HISTORY: Family History  Problem Relation Age of Onset  . Drug abuse Mother        opiod addiction  . Arthritis Mother        back pain  . Arthritis Father   . Arthritis/Rheumatoid Father   . Ankylosing spondylitis Father   . Seizures Maternal Aunt   . Parkinsonism Maternal Grandfather   . Diabetes Maternal Grandfather   . Lupus Maternal Aunt   . Mental illness Brother        bipolar disorder  . Parkinson's disease Paternal Grandmother   . Arthritis Sister   . Mental illness Sister   . Drug abuse Sister     SOCIAL HISTORY:  Social History   Socioeconomic History  . Marital status: Married    Spouse name: Jeneen Rinks  . Number of children: 1  . Years of education: 53  . Highest education level: Not on file  Occupational History  . Occupation: Education officer, museum  Tobacco Use  . Smoking status: Never Smoker  . Smokeless tobacco: Never Used  Substance and Sexual Activity  . Alcohol use: Yes    Alcohol/week: 1.0 standard drink    Types: 1 Standard drinks or equivalent per week  . Drug use: No  . Sexual activity: Yes    Partners: Male    Birth control/protection: Surgical  Other Topics Concern  . Not on file  Social History Narrative   Lives with husband and son, works as Education officer, museum, no dietary restrictions, exercises intermittently. Wears a seat belt regularly   Social Determinants of Health   Financial Resource Strain:   . Difficulty of Paying Living Expenses: Not on file   Food Insecurity:   . Worried About Charity fundraiser in the Last Year: Not on file  . Ran Out of Food in the Last Year: Not on file  Transportation Needs:   . Lack of Transportation (Medical): Not on file  . Lack of Transportation (Non-Medical): Not on file  Physical Activity:   . Days of Exercise per Week: Not on file  . Minutes of Exercise per Session: Not on file  Stress:   . Feeling of Stress : Not on file  Social Connections:   . Frequency of Communication with Friends and Family: Not on file  . Frequency of Social Gatherings with Friends and Family: Not on file  . Attends Religious Services: Not on file  . Active Member of Clubs or Organizations: Not on file  . Attends  Club or Organization Meetings: Not on file  . Marital Status: Not on file  Intimate Partner Violence:   . Fear of Current or Ex-Partner: Not on file  . Emotionally Abused: Not on file  . Physically Abused: Not on file  . Sexually Abused: Not on file     PHYSICAL EXAM  Vitals:   11/09/20 1521  BP: (!) 143/88  Pulse: 92  Weight: 162 lb (73.5 kg)  Height: 5' 3.25" (1.607 m)    Body mass index is 28.47 kg/m.   General: The patient is well-developed and well-nourished and in no acute distress.  Neck: She has mild tenderness over the right occiput.  Neurologic Exam  Mental status: The patient is alert and oriented x 3 at the time of the examination. The patient has apparent normal recent and remote memory, with an apparently normal attention span and concentration ability.   Speech is normal.  Cranial nerves: Extraocular movements are full.  Facial strength is normal.   No dysarthria.   No obvious hearing deficits are noted.  Motor:  There are no fasciculations.  Muscle bulk, tone and strength are normal.  Sensory: Allodynia over the right occiput.  There is intact sensation to touch and vibration in the arms and legs.  Coordination: Cerebellar testing reveals good finger-nose-finger and  heel-to-shin bilaterally.  Gait and station: Station and gait are normal.  Romberg is negative.  Reflexes: Deep tendon reflexes are mildly increased in the legs.. No ankle clonus.  _____________________________________________  Multiple sclerosis ([place]) - Plan: CBC with Differential/Platelet, MR BRAIN W WO CONTRAST  High risk medication use - Plan: CBC with Differential/Platelet, MR BRAIN W WO CONTRAST  Vitamin D deficiency - Plan: VITAMIN D 25 Hydroxy (Vit-D Deficiency, Fractures)  Other fatigue  Lymphopenia  1.   She will continue Vumerity.. We will recheck a CBC with differential .  If the lymphocyte count is well into the normal range (1.2 or higher, I would recommend that we increase the Vumerity dose again to 462 mg once a day and 231 mg once a day.  If the lymphocyte count is below 0.7 we will need to consider a different disease modifying therapy..We will check MRI of the brain and cervical spine to determine if there has been any subclinical progression.  If this is occurring we would need to consider a different disease modifying therapy. 2.   Stay active and exercise as tolerated. 3.    Continue Adderall.  She does not need a refill at this point. 4.   Return in 6 months or sooner if there are new or worsening neurologic symptoms.  Maxx Calaway A. Felecia Shelling, MD, PhD 03/0/0923, 3:00 PM Certified in Neurology, Clinical Neurophysiology, Sleep Medicine, Pain Medicine and Neuroimaging  [facility] [address], [place], [place] [address] [phone]

## 2020-11-10 LAB — CBC WITH DIFFERENTIAL/PLATELET
Basophils Absolute: 0.1 10*3/uL (ref 0.0–0.2)
Basos: 1 %
EOS (ABSOLUTE): 0.1 10*3/uL (ref 0.0–0.4)
Eos: 1 %
Hematocrit: 37.5 % (ref 34.0–46.6)
Hemoglobin: 13.7 g/dL (ref 11.1–15.9)
Immature Grans (Abs): 0 10*3/uL (ref 0.0–0.1)
Immature Granulocytes: 1 %
Lymphocytes Absolute: 0.9 10*3/uL (ref 0.7–3.1)
Lymphs: 13 %
MCH: 36.2 pg — ABNORMAL HIGH (ref 26.6–33.0)
MCHC: 36.5 g/dL — ABNORMAL HIGH (ref 31.5–35.7)
MCV: 99 fL — ABNORMAL HIGH (ref 79–97)
Monocytes Absolute: 0.4 10*3/uL (ref 0.1–0.9)
Monocytes: 5 %
Neutrophils Absolute: 5.8 10*3/uL (ref 1.4–7.0)
Neutrophils: 79 %
Platelets: 290 10*3/uL (ref 150–450)
RBC: 3.78 x10E6/uL (ref 3.77–5.28)
RDW: 13.9 % (ref 11.7–15.4)
WBC: 7.2 10*3/uL (ref 3.4–10.8)

## 2020-11-10 LAB — VITAMIN D 25 HYDROXY (VIT D DEFICIENCY, FRACTURES): Vit D, 25-Hydroxy: 48.8 ng/mL (ref 30.0–100.0)

## 2020-11-14 ENCOUNTER — Telehealth: Payer: Self-pay | Admitting: Neurology

## 2020-11-14 NOTE — Telephone Encounter (Signed)
cone umr order sent to GI. No auth they will reach out to the patient to schedule.  

## 2020-11-22 ENCOUNTER — Other Ambulatory Visit: Payer: Self-pay | Admitting: *Deleted

## 2020-11-22 MED ORDER — AMPHETAMINE-DEXTROAMPHETAMINE 10 MG PO TABS: ORAL_TABLET | ORAL | 0 refills | Status: DC

## 2020-11-22 MED FILL — AMPHETAMINE SALTS 10 MG: 10 | 30 days supply | Qty: 90 | Fill #0

## 2020-11-28 MED FILL — FLUOXETINE HCL 20 MG TABS: 20 | 90 days supply | Qty: 90 | Fill #2

## 2020-12-05 ENCOUNTER — Encounter: Payer: Self-pay | Admitting: *Deleted

## 2020-12-29 ENCOUNTER — Telehealth: Payer: Self-pay | Admitting: *Deleted

## 2020-12-29 NOTE — Telephone Encounter (Signed)
Submitted PA Vumerity on CMM. Key: [id number]. Waiting on determination from [employer].

## 2020-12-29 NOTE — Telephone Encounter (Signed)
The request has been approved. The authorization is effective for a maximum of 12 fills from 12/29/2020 to 12/28/2021, as long as the member is enrolled in their current health plan. The request was approved as submitted. This request has been approved for 4 capsules per day. A written notification letter will follow with additional details.

## 2021-01-10 ENCOUNTER — Ambulatory Visit: Payer: 59

## 2021-01-10 DIAGNOSIS — Z79899 Other long term (current) drug therapy: Secondary | ICD-10-CM

## 2021-01-10 DIAGNOSIS — G35 Multiple sclerosis: Secondary | ICD-10-CM

## 2021-01-10 MED ORDER — GADOBENATE DIMEGLUMINE 529 MG/ML IV SOLN
15.0000 mL | Freq: Once | INTRAVENOUS | Status: AC | PRN
  Administered 2021-01-10: 15 mL via INTRAVENOUS

## 2021-01-11 ENCOUNTER — Other Ambulatory Visit: Payer: Self-pay | Admitting: *Deleted

## 2021-01-11 MED ORDER — AMPHETAMINE-DEXTROAMPHETAMINE 10 MG PO TABS: ORAL_TABLET | ORAL | 0 refills | Status: DC

## 2021-01-11 MED FILL — AMPHETAMINE-DEXTROAMPHETAMI: 10 | 30 days supply | Qty: 90 | Fill #0

## 2021-02-21 ENCOUNTER — Other Ambulatory Visit (HOSPITAL_BASED_OUTPATIENT_CLINIC_OR_DEPARTMENT_OTHER): Payer: Self-pay

## 2021-02-21 MED FILL — PREVIDENT 5000 BOOSTER PLUS: 1.1 | 30 days supply | Qty: 100 | Fill #0

## 2021-02-23 ENCOUNTER — Other Ambulatory Visit: Payer: Self-pay | Admitting: *Deleted

## 2021-02-23 MED ORDER — AMPHETAMINE-DEXTROAMPHETAMINE 10 MG PO TABS: ORAL_TABLET | ORAL | 0 refills | Status: DC

## 2021-02-23 MED FILL — AMPHETAMINE-DEXTROAMPHETAMI: 10 | 30 days supply | Qty: 90 | Fill #0

## 2021-02-24 ENCOUNTER — Telehealth: Payer: Self-pay | Admitting: Medical

## 2021-02-24 ENCOUNTER — Encounter: Payer: Self-pay | Admitting: Medical

## 2021-02-24 ENCOUNTER — Ambulatory Visit (INDEPENDENT_AMBULATORY_CARE_PROVIDER_SITE_OTHER): Payer: 59 | Admitting: Medical

## 2021-02-24 ENCOUNTER — Other Ambulatory Visit: Payer: Self-pay

## 2021-02-24 VITALS — BP 112/83 | HR 89 | Temp 98.1°F | Resp 18 | Ht 63.0 in | Wt 170.0 lb

## 2021-02-24 DIAGNOSIS — R5383 Other fatigue: Secondary | ICD-10-CM

## 2021-02-24 DIAGNOSIS — Z1239 Encounter for other screening for malignant neoplasm of breast: Secondary | ICD-10-CM | POA: Diagnosis not present

## 2021-02-24 DIAGNOSIS — Z113 Encounter for screening for infections with a predominantly sexual mode of transmission: Secondary | ICD-10-CM | POA: Diagnosis not present

## 2021-02-24 DIAGNOSIS — M7989 Other specified soft tissue disorders: Secondary | ICD-10-CM

## 2021-02-24 DIAGNOSIS — Z124 Encounter for screening for malignant neoplasm of cervix: Secondary | ICD-10-CM | POA: Diagnosis not present

## 2021-02-24 DIAGNOSIS — Z0000 Encounter for general adult medical examination without abnormal findings: Secondary | ICD-10-CM

## 2021-02-24 DIAGNOSIS — E559 Vitamin D deficiency, unspecified: Secondary | ICD-10-CM

## 2021-02-24 LAB — CBC WITH DIFFERENTIAL/PLATELET
Basophils Absolute: 0.1 10*3/uL (ref 0.0–0.1)
Basophils Relative: 1.1 % (ref 0.0–3.0)
Eosinophils Absolute: 0.1 10*3/uL (ref 0.0–0.7)
Eosinophils Relative: 2.7 % (ref 0.0–5.0)
HCT: 40.9 % (ref 36.0–46.0)
Hemoglobin: 14.1 g/dL (ref 12.0–15.0)
Lymphocytes Relative: 15.6 % (ref 12.0–46.0)
Lymphs Abs: 0.7 10*3/uL (ref 0.7–4.0)
MCHC: 34.5 g/dL (ref 30.0–36.0)
MCV: 101.3 fl — ABNORMAL HIGH (ref 78.0–100.0)
Monocytes Absolute: 0.3 10*3/uL (ref 0.1–1.0)
Monocytes Relative: 6.7 % (ref 3.0–12.0)
Neutro Abs: 3.3 10*3/uL (ref 1.4–7.7)
Neutrophils Relative %: 73.9 % (ref 43.0–77.0)
Platelets: 259 10*3/uL (ref 150.0–400.0)
RBC: 4.03 Mil/uL (ref 3.87–5.11)
RDW: 13.6 % (ref 11.5–15.5)
WBC: 4.4 10*3/uL (ref 4.0–10.5)

## 2021-02-24 LAB — COMPREHENSIVE METABOLIC PANEL
ALT: 13 U/L (ref 0–35)
AST: 17 U/L (ref 0–37)
Albumin: 4.5 g/dL (ref 3.5–5.2)
Alkaline Phosphatase: 40 U/L (ref 39–117)
BUN: 12 mg/dL (ref 6–23)
CO2: 26 mEq/L (ref 19–32)
Calcium: 9.2 mg/dL (ref 8.4–10.5)
Chloride: 106 mEq/L (ref 96–112)
Creatinine, Ser: 0.8 mg/dL (ref 0.40–1.20)
GFR: 90.74 mL/min (ref >60.00)
Glucose, Bld: 95 mg/dL (ref 70–99)
Potassium: 4.1 mEq/L (ref 3.5–5.1)
Sodium: 140 mEq/L (ref 135–145)
Total Bilirubin: 0.8 mg/dL (ref 0.2–1.2)
Total Protein: 7.1 g/dL (ref 6.0–8.3)

## 2021-02-24 LAB — T4, FREE: Free T4: 0.84 ng/dL (ref 0.60–1.60)

## 2021-02-24 LAB — LIPID PANEL
Cholesterol: 214 mg/dL — ABNORMAL HIGH (ref 0–200)
HDL: 51 mg/dL (ref >39.00)
LDL Cholesterol: 142 mg/dL — ABNORMAL HIGH (ref 0–99)
NonHDL: 162.94
Total CHOL/HDL Ratio: 4
Triglycerides: 105 mg/dL (ref 0.0–149.0)
VLDL: 21 mg/dL (ref 0.0–40.0)

## 2021-02-24 LAB — VITAMIN B12: Vitamin B-12: 137 pg/mL — ABNORMAL LOW (ref 211–911)

## 2021-02-24 LAB — TSH: TSH: 2.09 u[IU]/mL (ref 0.35–4.50)

## 2021-02-24 NOTE — Progress Notes (Signed)
Subjective:    Patient ID: Laurie Cole, female    DOB: [birth year], 45 y.o.   MRN: [id number]  HPI  Pt in for wellness exam  Pt is fasting.   Pt states not exercising. She is beginning to try to exercise. Preparing for MS challenge walk. States eating healthy. Non smoker. Mild /couple of times a month glass of wine.  Pt thinks had tdap in the past.  Last pap more than 3 years ago. Last one was normal.   Pt is on seasonique contraception for 2 months.   Pt had swelling of hands for 2 weeks. Also retaining fluid. Pt has gained weight about 150 lb. Weighted 150 2 months. No dyspnea on exertion.   Review of Systems  Constitutional: Negative for chills, fatigue and fever.  HENT: Negative for congestion, ear discharge and ear pain.   Respiratory: Negative for cough, chest tightness, shortness of breath and wheezing.   Cardiovascular: Negative for chest pain and palpitations.  Gastrointestinal: Negative for abdominal pain, blood in stool, constipation, nausea and vomiting.  Genitourinary: Negative for dysuria, flank pain, frequency, hematuria and urgency.  Musculoskeletal: Negative for back pain and joint swelling.  Skin: Negative for rash.  Neurological: Negative for dizziness, speech difficulty, weakness, numbness and headaches.  Hematological: Negative for adenopathy. Does not bruise/bleed easily.  Psychiatric/Behavioral: Negative for behavioral problems, confusion and dysphoric mood. The patient is not nervous/anxious.      Past Medical History:  Diagnosis Date  . Depression   . MS (multiple sclerosis) ([place])    sees Dr Kerman Passey in [place] History   Socioeconomic History  . Marital status: Married    Spouse name: Jeneen Rinks  . Number of children: 1  . Years of education: 51  . Highest education level: Not on file  Occupational History  . Occupation: Education officer, museum  Tobacco Use  . Smoking status: Never Smoker  . Smokeless tobacco: Never Used  Substance and Sexual  Activity  . Alcohol use: Yes    Alcohol/week: 1.0 standard drink    Types: 1 Standard drinks or equivalent per week  . Drug use: No  . Sexual activity: Yes    Partners: Male    Birth control/protection: Surgical  Other Topics Concern  . Not on file  Social History Narrative   Lives with husband and son, works as Education officer, museum, no dietary restrictions, exercises intermittently. Wears a seat belt regularly   Social Determinants of Health   Financial Resource Strain: Not on file  Food Insecurity: Not on file  Transportation Needs: Not on file  Physical Activity: Not on file  Stress: Not on file  Social Connections: Not on file  Intimate Partner Violence: Not on file    Past Surgical History:  Procedure Laterality Date  . LIPOSUCTION      Family History  Problem Relation Age of Onset  . Drug abuse Mother        opiod addiction  . Arthritis Mother        back pain  . Arthritis Father   . Arthritis/Rheumatoid Father   . Ankylosing spondylitis Father   . Seizures Maternal Aunt   . Parkinsonism Maternal Grandfather   . Diabetes Maternal Grandfather   . Lupus Maternal Aunt   . Mental illness Brother        bipolar disorder  . Parkinson's disease Paternal Grandmother   . Arthritis Sister   . Mental illness Sister   . Drug abuse Sister  No Known Allergies  Current Outpatient Medications on File Prior to Visit  Medication Sig Dispense Refill  . amphetamine-dextroamphetamine (ADDERALL) 10 MG tablet Take one tablet 3 times daily as needed. 90 tablet 0  . Biotin 10000 MCG TABS Take by mouth.    . cephALEXin (KEFLEX) 500 MG capsule Take 1 capsule (500 mg total) by mouth 3 (three) times daily. (Patient not taking: Reported on 02/24/2021) 30 capsule 0  . cholecalciferol (VITAMIN D) 1000 units tablet Take 5,000 Units by mouth daily.    . Diroximel Fumarate (VUMERITY) 231 MG CPDR Take 2 capsules by mouth 2 (two) times daily.     Marland Kitchen FLUoxetine (PROZAC) 20 MG tablet Take 1/2 to  1 TABLET daily 90 tablet 3  . norethindrone (AYGESTIN) 5 MG tablet Take 5 mg by mouth 3 (three) times daily.     No current facility-administered medications on file prior to visit.    BP 112/83   Pulse 89   Temp 98.1 F (36.7 C)   Resp 18   Ht 5\' 3"  (1.6 m)   Wt 170 lb (77.1 kg)   SpO2 99%   BMI 30.11 kg/m       Objective:   Physical Exam  General Mental Status- Alert. General Appearance- Not in acute distress.   Skin General: Color- Normal Color. Moisture- Normal Moisture.  Neck Carotid Arteries- Normal color. Moisture- Normal Moisture. No carotid bruits. No JVD.  Chest and Lung Exam Auscultation: Breath Sounds:-Normal.  Cardiovascular Auscultation:Rythm- Regular. Murmurs & Other Heart Sounds:Auscultation of the heart reveals- No Murmurs.  Abdomen Inspection:-Inspeection Normal. Palpation/Percussion:Note:No mass. Palpation and Percussion of the abdomen reveal- Non Tender, Non Distended + BS, no rebound or guarding.   Neurologic Cranial Nerve exam:- CN III-XII intact(No nystagmus), symmetric smile. Strength:- 5/5 equal and symmetric strength both upper and lower extremities.  Lower extremity- no pedal edema.    Assessment & Plan:  For you wellness exam today I have ordered cbc, cmp, lipid panel and hiv.  For fatigue will get tsh, t4, b12, b1 and vit D.  Can get tdap later if needed. If you confirm not up to date.  Recommend exercise and healthy diet.  We will let you know lab results as they come in.  Follow up date appointment will be determined after lab review.   For hand swelling and lower extremity. Consider associated with weight gain, contraception use and other differential as discussed. Dr. Charlett Blake not in. Recommend following labs order first. Will also send message to your pcp. Will see if she agrees on expanding work up such as with inflammatory lab studies, bnp and chest xray.  Refer to gyn for cervical cancer screening and place order  mammogram.  Follow up date to be determined after lab review.   Mackie Pai, [place]   [place] charge as well. Discssed and counsel on weight gain and hand swelling. Approach toe evaluation  and will contact pcp to get direction on further work beyond what I ordered today.

## 2021-02-24 NOTE — Telephone Encounter (Signed)
Future labs placed. 

## 2021-02-24 NOTE — Telephone Encounter (Signed)
Dr. Charlett Blake,  I saw pt of yours for wellness exam. She mentioned some recent bilateral hand swelling in the past 2 weeks with some swelling in her lower pretibial regions.  She has been on [place] I think for about 2 months maybe longer.  She also gained about 20 pounds over the past 2 months.  She has history of MS with mild fatigue.  I did discuss standard wellness labs and included B12, B1, TSH, T4 and vitamin D.  She appears to be anxious about the hand and lower extremity swelling.   I explained to her that I would reach out to you to see if without further work-up was needed.  She is going to see her gynecologist I think within the next 2 to 4 weeks.  One of her parents has rheumatoid arthritis.  Patient does not have any joint pains.  Consider doing inflammatory lab panel since her hands do look a bit swollen.  However not warm or tender.  Also considered BNP and chest x-ray but clinically did not think that was indicated.  Just wanted your opinion as patient expressed desire for work-up.  I think the weight gain probably the reason, contraceptive use and combination of warmer weather  Thanks,  Mackie Pai, PA-C

## 2021-02-24 NOTE — Telephone Encounter (Signed)
So would be reasonable to add RF and ANA to start her work agree the swelling is likely related to weight gain, warmer weather and possibly arthritic. The OCP is likely playing a role in her weight gain. IS it possible to add the RF and ANA to labs already drawn today?

## 2021-02-24 NOTE — Patient Instructions (Addendum)
For you wellness exam today I have ordered cbc, cmp, lipid panel and hiv.  For fatigue will get tsh, t4, b12, b1 and vit D.  Can get tdap later if needed. If you confirm not up to date.  Recommend exercise and healthy diet.  We will let you know lab results as they come in.  Follow up date appointment will be determined after lab review.   For hand swelling and lower extremity. Consider associated with weight gain, contraception use and other differential as discussed. Dr. Abner Greenspan not in. Recommend following labs order first. Will also send message to your pcp. Will see if she agrees on expanding work up such as with inflammatory lab studies, bnp and chest xray.  Refer to gyn for cervical cancer screening and place order mammogram.   Follow up date to be determined after lab review.   Preventive Care 24-37 Years Old, Female Preventive care refers to lifestyle choices and visits with your health care provider that can promote health and wellness. This includes:  A yearly physical exam. This is also called an annual wellness visit.  Regular dental and eye exams.  Immunizations.  Screening for certain conditions.  Healthy lifestyle choices, such as: ? Eating a healthy diet. ? Getting regular exercise. ? Not using drugs or products that contain nicotine and tobacco. ? Limiting alcohol use. What can I expect for my preventive care visit? Physical exam Your health care provider will check your:  Height and weight. These may be used to calculate your BMI (body mass index). BMI is a measurement that tells if you are at a healthy weight.  Heart rate and blood pressure.  Body temperature.  Skin for abnormal spots. Counseling Your health care provider may ask you questions about your:  Past medical problems.  Family's medical history.  Alcohol, tobacco, and drug use.  Emotional well-being.  Home life and relationship well-being.  Sexual activity.  Diet, exercise, and  sleep habits.  Work and work Astronomer.  Access to firearms.  Method of birth control.  Menstrual cycle.  Pregnancy history. What immunizations do I need? Vaccines are usually given at various ages, according to a schedule. Your health care provider will recommend vaccines for you based on your age, medical history, and lifestyle or other factors, such as travel or where you work.   What tests do I need? Blood tests  Lipid and cholesterol levels. These may be checked every 5 years, or more often if you are over 46 years old.  Hepatitis C test.  Hepatitis B test. Screening  Lung cancer screening. You may have this screening every year starting at age 45 if you have a 30-pack-year history of smoking and currently smoke or have quit within the past 15 years.  Colorectal cancer screening. ? All adults should have this screening starting at age 43 and continuing until age 39. ? Your health care provider may recommend screening at age 1 if you are at increased risk. ? You will have tests every 1-10 years, depending on your results and the type of screening test.  Diabetes screening. ? This is done by checking your blood sugar (glucose) after you have not eaten for a while (fasting). ? You may have this done every 1-3 years.  Mammogram. ? This may be done every 1-2 years. ? Talk with your health care provider about when you should start having regular mammograms. This may depend on whether you have a family history of breast cancer.  BRCA-related cancer  screening. This may be done if you have a family history of breast, ovarian, tubal, or peritoneal cancers.  Pelvic exam and Pap test. ? This may be done every 3 years starting at age 48. ? Starting at age 51, this may be done every 5 years if you have a Pap test in combination with an HPV test. Other tests  STD (sexually transmitted disease) testing, if you are at risk.  Bone density scan. This is done to screen for  osteoporosis. You may have this scan if you are at high risk for osteoporosis. Talk with your health care provider about your test results, treatment options, and if necessary, the need for more tests. Follow these instructions at home: Eating and drinking  Eat a diet that includes fresh fruits and vegetables, whole grains, lean protein, and low-fat dairy products.  Take vitamin and mineral supplements as recommended by your health care provider.  Do not drink alcohol if: ? Your health care provider tells you not to drink. ? You are pregnant, may be pregnant, or are planning to become pregnant.  If you drink alcohol: ? Limit how much you have to 0-1 drink a day. ? Be aware of how much alcohol is in your drink. In the [place] one drink equals one 12 oz bottle of beer (355 mL), one 5 oz glass of wine (148 mL), or one 1 oz glass of hard liquor (44 mL).   Lifestyle  Take daily care of your teeth and gums. Brush your teeth every morning and night with fluoride toothpaste. Floss one time each day.  Stay active. Exercise for at least 30 minutes 5 or more days each week.  Do not use any products that contain nicotine or tobacco, such as cigarettes, e-cigarettes, and chewing tobacco. If you need help quitting, ask your health care provider.  Do not use drugs.  If you are sexually active, practice safe sex. Use a condom or other form of protection to prevent STIs (sexually transmitted infections).  If you do not wish to become pregnant, use a form of birth control. If you plan to become pregnant, see your health care provider for a prepregnancy visit.  If told by your health care provider, take low-dose aspirin daily starting at age 39.  Find healthy ways to cope with stress, such as: ? Meditation, yoga, or listening to music. ? Journaling. ? Talking to a trusted person. ? Spending time with friends and family. Safety  Always wear your seat belt while driving or riding in a vehicle.  Do  not drive: ? If you have been drinking alcohol. Do not ride with someone who has been drinking. ? When you are tired or distracted. ? While texting.  Wear a helmet and other protective equipment during sports activities.  If you have firearms in your house, make sure you follow all gun safety procedures. What's next?  Visit your health care provider once a year for an annual wellness visit.  Ask your health care provider how often you should have your eyes and teeth checked.  Stay up to date on all vaccines. This information is not intended to replace advice given to you by your health care provider. Make sure you discuss any questions you have with your health care provider. Document Revised: 08/23/2020 Document Reviewed: 07/31/2018 Elsevier Patient Education  2021 [employer].

## 2021-02-25 ENCOUNTER — Encounter (HOSPITAL_BASED_OUTPATIENT_CLINIC_OR_DEPARTMENT_OTHER): Payer: Self-pay

## 2021-02-25 ENCOUNTER — Ambulatory Visit (HOSPITAL_BASED_OUTPATIENT_CLINIC_OR_DEPARTMENT_OTHER)
Admission: RE | Admit: 2021-02-25 | Discharge: 2021-02-25 | Disposition: A | Discharge status: Home / Self Care | Payer: 59 | Source: Ambulatory Visit | Attending: Medical | Admitting: Medical

## 2021-02-25 DIAGNOSIS — Z1231 Encounter for screening mammogram for malignant neoplasm of breast: Secondary | ICD-10-CM | POA: Diagnosis not present

## 2021-02-25 DIAGNOSIS — Z1239 Encounter for other screening for malignant neoplasm of breast: Secondary | ICD-10-CM

## 2021-02-25 NOTE — Telephone Encounter (Signed)
Ok. Thanks for help. Pt has low b12 and advising initially weekly b12 injection. If not able to add then will have her do labs when in for b12 injectoin.

## 2021-02-27 NOTE — Telephone Encounter (Signed)
Ok thanks 

## 2021-02-27 NOTE — Addendum Note (Signed)
Addended by: Kelle Darting A on: 02/27/2021 03:52 PM   Modules accepted: Orders

## 2021-02-27 NOTE — Telephone Encounter (Signed)
Per Barbaraann Share at [facility], they will add ANA and rheumatoid factor to previous labs.

## 2021-02-28 ENCOUNTER — Ambulatory Visit (INDEPENDENT_AMBULATORY_CARE_PROVIDER_SITE_OTHER): Payer: 59

## 2021-02-28 ENCOUNTER — Other Ambulatory Visit: Payer: Self-pay

## 2021-02-28 DIAGNOSIS — E538 Deficiency of other specified B group vitamins: Secondary | ICD-10-CM | POA: Diagnosis not present

## 2021-02-28 MED ORDER — CYANOCOBALAMIN 1000 MCG/ML IJ SOLN
1000.0000 ug | Freq: Once | INTRAMUSCULAR | Status: AC
  Administered 2021-02-28: 1000 ug via INTRAMUSCULAR

## 2021-02-28 NOTE — Progress Notes (Addendum)
Laurie Cole is a 45 y.o. female presents to the office today for weekly b12 injections, per physician's orders. Original order: 02/24/2021 b12 (med), 1000 mcg (dose),  IM (route) was administered left deltoid (location) today. Patient tolerated injection. Patient next injection due: 1 week, appt made Yes  Johnney Killian, PA-C

## 2021-03-01 ENCOUNTER — Other Ambulatory Visit: Payer: Self-pay | Admitting: Medical

## 2021-03-01 DIAGNOSIS — R928 Other abnormal and inconclusive findings on diagnostic imaging of breast: Secondary | ICD-10-CM

## 2021-03-03 ENCOUNTER — Encounter: Payer: Self-pay | Admitting: Medical

## 2021-03-03 ENCOUNTER — Telehealth: Payer: Self-pay | Admitting: Medical

## 2021-03-03 DIAGNOSIS — R768 Other specified abnormal immunological findings in serum: Secondary | ICD-10-CM

## 2021-03-03 DIAGNOSIS — M2550 Pain in unspecified joint: Secondary | ICD-10-CM

## 2021-03-03 LAB — VITAMIN D 1,25 DIHYDROXY
Vitamin D 1, 25 (OH)2 Total: 72 pg/mL (ref 18–72)
Vitamin D2 1, 25 (OH)2: <8 pg/mL
Vitamin D3 1, 25 (OH)2: 72 pg/mL

## 2021-03-03 LAB — ANA: Anti Nuclear Antibody (ANA): POSITIVE — AB

## 2021-03-03 LAB — ANTI-NUCLEAR AB-TITER (ANA TITER)
ANA TITER: 1:320 {titer} — ABNORMAL HIGH
ANA Titer 1: 1:80 {titer} — ABNORMAL HIGH

## 2021-03-03 LAB — RHEUMATOID FACTOR: Rheumatoid fact SerPl-aCnc: <14 IU/mL (ref <14)

## 2021-03-03 LAB — VITAMIN B1: Vitamin B1 (Thiamine): <6 nmol/L — ABNORMAL LOW (ref 8–30)

## 2021-03-03 LAB — TEST AUTHORIZATION

## 2021-03-03 LAB — HIV ANTIBODY (ROUTINE TESTING W REFLEX): HIV 1&2 Ab, 4th Generation: NONREACTIVE

## 2021-03-03 NOTE — Telephone Encounter (Signed)
Refer to rheumatologist 

## 2021-03-06 ENCOUNTER — Telehealth: Payer: Self-pay | Admitting: Medical

## 2021-03-06 NOTE — Telephone Encounter (Signed)
Please cancel referral to rheumatologist. Pt declines referral.

## 2021-03-07 NOTE — Telephone Encounter (Signed)
Done

## 2021-03-10 ENCOUNTER — Ambulatory Visit
Admission: RE | Admit: 2021-03-10 | Discharge: 2021-03-10 | Disposition: A | Discharge status: Home / Self Care | Payer: 59 | Source: Ambulatory Visit | Attending: Medical | Admitting: Medical

## 2021-03-10 ENCOUNTER — Other Ambulatory Visit: Payer: Self-pay | Admitting: Medical

## 2021-03-10 ENCOUNTER — Ambulatory Visit (INDEPENDENT_AMBULATORY_CARE_PROVIDER_SITE_OTHER): Payer: 59

## 2021-03-10 ENCOUNTER — Ambulatory Visit: Payer: 59

## 2021-03-10 ENCOUNTER — Other Ambulatory Visit: Payer: Self-pay

## 2021-03-10 DIAGNOSIS — R928 Other abnormal and inconclusive findings on diagnostic imaging of breast: Secondary | ICD-10-CM

## 2021-03-10 DIAGNOSIS — R922 Inconclusive mammogram: Secondary | ICD-10-CM | POA: Diagnosis not present

## 2021-03-10 DIAGNOSIS — E538 Deficiency of other specified B group vitamins: Secondary | ICD-10-CM | POA: Diagnosis not present

## 2021-03-10 DIAGNOSIS — N6312 Unspecified lump in the right breast, upper inner quadrant: Secondary | ICD-10-CM | POA: Diagnosis not present

## 2021-03-10 MED ORDER — CYANOCOBALAMIN 1000 MCG/ML IJ SOLN
1000.0000 ug | Freq: Once | INTRAMUSCULAR | Status: AC
  Administered 2021-03-10: 1000 ug via INTRAMUSCULAR

## 2021-03-10 NOTE — Progress Notes (Addendum)
Laurie Cole is a 45 y.o. female presents to the office today for weekly b12 injections, per physician's orders. Original order: 02/24/2021 b12 (med), 1000 mcg (dose),  IM (route) was administered left deltoid (location) today. Patient tolerated injection. Patient next injection due: 1 week, appt made Yes  [name]   Noted. Agree with above.  Emery, DO 03/15/21 2:12 PM

## 2021-03-15 ENCOUNTER — Other Ambulatory Visit: Payer: Self-pay

## 2021-03-15 ENCOUNTER — Ambulatory Visit (INDEPENDENT_AMBULATORY_CARE_PROVIDER_SITE_OTHER): Payer: 59 | Admitting: Family Medicine

## 2021-03-15 ENCOUNTER — Ambulatory Visit: Payer: Self-pay

## 2021-03-15 VITALS — BP 126/86 | HR 118 | Ht 63.0 in

## 2021-03-15 DIAGNOSIS — M79669 Pain in unspecified lower leg: Secondary | ICD-10-CM | POA: Diagnosis not present

## 2021-03-15 NOTE — Patient Instructions (Addendum)
Thank you for coming in today.  Good arch support.   Please use voltaren gel up to 4x daily for pain as needed.    I recommend you obtained a compression sleeve to help with your joint problems. There are many options on the market however I recommend obtaining a full calf Body Helix compression sleeve.  You can find information (including how to appropriate measure yourself for sizing) can be found at www.Body http://www.lambert.com/.  Many of these products are health savings account (HSA) eligible.   You can use the compression sleeve at any time throughout the day but is most important to use while being active as well as for 2 hours post-activity.   It is appropriate to ice following activity with the compression sleeve in place.  Please complete the exercises that the athletic trainer went over with you: View at my-exercise-code.com using code: [id number]  If not improving let me know.  Typically MRI is about 6 weeks.     Shin Splints  Shin splints is a painful condition that is felt either on the bone that is located in the front of the lower leg (tibia or shin bone) or in the muscles on either side of the bone. This condition happens when physical activities lead to inflammation of the muscles, tendons, and the thin layer of tissue that covers the shin bone. It may result from participating in sports or other intense exercise. What are the causes? This condition may be caused by:  Overuse of muscles.  Repetitive activities.  Flat feet or rigid arches. Activities that could contribute to shin splints include:  Having a sudden increase in exercise time.  Starting a new, intense activity.  Running up hills or long distances.  Playing sports that involve sudden starts and stops.  Not warming up before activity.  Wearing old or worn-out shoes. What are the signs or symptoms? The main symptom of this condition is pain that occurs:  On the front of the lower leg.  In the muscles on  either side of the shin bone.  While exercising or at rest. How is this diagnosed? This condition may be diagnosed based on:  A physical exam.  Your symptoms.  An observation of you while you are walking or running.  X-rays or other imaging tests. These may be done to rule out other problems. How is this treated? Treatment for this condition depends on your age, history, overall health, and how bad the pain is. Most cases of shin splints can be managed by doing one or more of the following:  Resting.  Reducing the length and intensity of your exercise.  Stopping the activity that causes shin pain.  Taking medicines to control the inflammation.  Icing, massaging, stretching, and strengthening the affected area.  Wearing shoes that have rigid heels, shock absorption, and a good arch support. For severe shin pain, your health care provider may recommend that you use crutches to avoid putting weight on your legs. Follow these instructions at home: Medicines  Take over-the-counter and prescription medicines only as told by your health care provider.  Do not drive or use heavy machinery while taking prescription pain medicine. Managing pain, stiffness, and swelling  If directed, apply ice to the painful area. Icing can help to relieve pain and swelling. ? Put ice in a plastic bag. ? Place a towel between your skin and the bag. ? Leave the ice on for 20 minutes, 2-3 times a day.  If directed, apply heat  to the painful area before stretching exercises, or as told by your health care provider. Heat can help to relax your muscles. Use the heat source that your health care provider recommends, such as a moist heat pack or a heating pad. ? Place a towel between your skin and the heat source. ? Leave the heat on for 20-30 minutes. ? Remove the heat if your skin turns bright red. This is especially important if you are unable to feel pain, heat, or cold. You may have a greater risk of  getting burned.  Massage, stretch, and strengthen the affected area as directed by your health care provider.  Wear compression sleeves or socks as told by your health care provider.  Raise (elevate) your legs above the level of your heart while you are sitting or lying down.      Activity  Rest as needed. Return to activity gradually as told by your health care provider.  When you start exercising again, begin with non-weight-bearing exercises, such as cycling or swimming.  Stop running if the pain returns.  Warm up properly before exercising.  Run on a surface that is level and fairly firm.  Gradually change the intensity of an exercise.  If you increase your running distance, add only 5-10% to your distance each week. This means that if you are running 5 miles this week, you should only increase your run by - mile for next week. General instructions  Wear shoes that have rigid heels, shock absorption, and a good arch support.  Change your athletic shoes every 6 months, or every 350-450 miles.  Keep all follow-up visits as told by your health care provider. This is important. Contact a health care provider if:  Your symptoms continue, or they worsen even after treatment.  The location, intensity, or type of pain changes over time.  You have swelling in your lower leg that gets worse.  Your shin becomes red and feels warm. Get help right away if:  You have severe pain.  You have trouble walking. Summary  Shin splints happens when physical activities lead to inflammation of the muscles, tendons, and the thin layer of tissue that covers the shin bone.  Treatments may include medicines, resting, and icing.  Return to activity gradually as directed by your health care provider.  Make sure you know what symptoms should cause you to contact your health care provider. This information is not intended to replace advice given to you by your health care provider. Make  sure you discuss any questions you have with your health care provider. Document Revised: 12/09/2017 Document Reviewed: 12/09/2017 Elsevier Patient Education  2021 [employer].

## 2021-03-15 NOTE — Progress Notes (Signed)
   I, Laurie Cole, LAT, ATC acting as a scribe for [name], MD.  Subjective:    CC: Shin splints  HPI: Pt is a 45 y/o female presenting w/ B lower leg pain x 2 weeks, R>L. Pt has recently re-started doing some long distance walking w/o her orthotics. She locates her pain to dorsum of foot up to anterior lower leg. Pt is about to start an 18-week training for a 50 mile walk. Pt notes tightness in her calves.  Swelling: no Aggravating factors: driving, walking, wearing wrong shoes w/o orthotics. Treatments tried: ice, stretching, Moitrin  Pertinent review of Systems: No fevers or chills  Relevant historical information: History of MS.  Does not feel like MS flare   Objective:    Vitals:   03/15/21 1450  BP: 126/86  Pulse: (!) 118  SpO2: 99%   General: Well Developed, well nourished, and in no acute distress.   MSK:  Right tibia: Normal-appearing Minimally tender palpation medial tibia Calf nontender.  Right ankle normal-appearing normal motion.  Intact strength. Right foot pronation intact strength.  Pulses capillary fill and sensation are intact distally.  Lab and Radiology Results  Diagnostic Limited MSK Ultrasound of: Right foot ankle and tib-fib Normal appearing medial aspect of tibia Intact normal-appearing tibial anterior tendon Intact normal extensor digitorum tendons Impression: Likely shinsplints     Impression and Recommendations:    Assessment and Plan: 45 y.o. 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 Patient has similar symptoms on the left side as well.  Plan for continued arch support, eccentric exercises, calf compression, and mild activity restriction.  Discussed return to walking protocol and ways to be successful as she ramps her activity up in preparation for a 50 mile walk in September.  Home exercises taught in clinic today by ATC.  If not improving in the next few months would consider x-ray and  MRI.  PDMP not reviewed this encounter. Orders Placed This Encounter  Procedures  . [place] LIMITED JOINT SPACE STRUCTURES LOW BILAT(NO LINKED CHARGES)    Standing Status:   Future    Number of Occurrences:   1    Standing Expiration Date:   09/14/2021    Order Specific Question:   Reason for Exam (SYMPTOM  OR DIAGNOSIS REQUIRED)    Answer:   lower leg pain    Order Specific Question:   Preferred imaging location?    Answer:   [facility]   No orders of the defined types were placed in this encounter.   Discussed warning signs or symptoms. Please see discharge instructions. Patient expresses understanding.   The above documentation has been reviewed and is accurate and complete [name], M.D.

## 2021-03-16 ENCOUNTER — Ambulatory Visit (INDEPENDENT_AMBULATORY_CARE_PROVIDER_SITE_OTHER): Payer: 59

## 2021-03-16 DIAGNOSIS — E538 Deficiency of other specified B group vitamins: Secondary | ICD-10-CM | POA: Diagnosis not present

## 2021-03-16 MED ORDER — CYANOCOBALAMIN 1000 MCG/ML IJ SOLN
1000.0000 ug | Freq: Once | INTRAMUSCULAR | Status: AC
  Administered 2021-03-16: 1000 ug via INTRAMUSCULAR

## 2021-03-16 NOTE — Progress Notes (Signed)
Laurie Cole is a 45 y.o. female presents to the office today for B12 injection # 3 of 4 weekly, per physician's orders. Original order: laced on 02-24-21.  cyanocobalamin (med),1000 mcg/ml (dose),  im (route) was administered left deltoid (location) today. Patient tolerated injection.  Patient next injection due:in one week, appt made for 03-24-21.  Jiles Prows

## 2021-03-24 ENCOUNTER — Ambulatory Visit: Payer: 59

## 2021-03-24 ENCOUNTER — Other Ambulatory Visit: Payer: 59

## 2021-04-10 ENCOUNTER — Other Ambulatory Visit: Payer: Self-pay | Admitting: *Deleted

## 2021-04-10 ENCOUNTER — Other Ambulatory Visit (HOSPITAL_BASED_OUTPATIENT_CLINIC_OR_DEPARTMENT_OTHER): Payer: Self-pay

## 2021-04-10 MED ORDER — AMPHETAMINE-DEXTROAMPHETAMINE 10 MG PO TABS
ORAL_TABLET | Freq: Three times a day (TID) | ORAL | 0 refills | Status: DC | PRN
  Filled 2021-04-10: qty 90, 30d supply, fill #0

## 2021-04-11 ENCOUNTER — Telehealth: Payer: Self-pay | Admitting: *Deleted

## 2021-04-11 ENCOUNTER — Other Ambulatory Visit (HOSPITAL_BASED_OUTPATIENT_CLINIC_OR_DEPARTMENT_OTHER): Payer: Self-pay

## 2021-04-11 NOTE — Telephone Encounter (Signed)
Submitted PA Adderall on CMM. OVF:[id number] - PA Case ID: [id number]. Waiting on determination from [place].

## 2021-04-12 ENCOUNTER — Other Ambulatory Visit (HOSPITAL_BASED_OUTPATIENT_CLINIC_OR_DEPARTMENT_OTHER): Payer: Self-pay

## 2021-04-12 NOTE — Telephone Encounter (Signed)
Received fax from [facility] that [place] approved 04/11/21-04/10/22 for max 12 refills. Approved 3/day. PA ref#[id number]

## 2021-04-24 ENCOUNTER — Telehealth: Payer: Self-pay | Admitting: *Deleted

## 2021-04-24 NOTE — Telephone Encounter (Signed)
Faxed completed/signed refill order back to [facility] for [facility]. #120, 11 refills. Directions: Take 2 caps po BID. Fax: [phone]. Received fax confirmation.

## 2021-04-29 DIAGNOSIS — Z20822 Contact with and (suspected) exposure to covid-19: Secondary | ICD-10-CM | POA: Diagnosis not present

## 2021-04-29 DIAGNOSIS — Z03818 Encounter for observation for suspected exposure to other biological agents ruled out: Secondary | ICD-10-CM | POA: Diagnosis not present

## 2021-05-10 ENCOUNTER — Ambulatory Visit: Payer: 59 | Admitting: Neurology

## 2021-05-16 ENCOUNTER — Other Ambulatory Visit: Payer: Self-pay

## 2021-05-16 ENCOUNTER — Ambulatory Visit: Payer: 59 | Admitting: Neurology

## 2021-05-16 ENCOUNTER — Encounter: Payer: Self-pay | Admitting: Neurology

## 2021-05-16 ENCOUNTER — Other Ambulatory Visit (HOSPITAL_BASED_OUTPATIENT_CLINIC_OR_DEPARTMENT_OTHER): Payer: Self-pay

## 2021-05-16 ENCOUNTER — Other Ambulatory Visit: Payer: Self-pay | Admitting: *Deleted

## 2021-05-16 VITALS — BP 121/81 | HR 98 | Ht 63.0 in | Wt 175.5 lb

## 2021-05-16 DIAGNOSIS — R600 Localized edema: Secondary | ICD-10-CM

## 2021-05-16 DIAGNOSIS — F419 Anxiety disorder, unspecified: Secondary | ICD-10-CM

## 2021-05-16 DIAGNOSIS — G35 Multiple sclerosis: Secondary | ICD-10-CM | POA: Diagnosis not present

## 2021-05-16 DIAGNOSIS — D72810 Lymphocytopenia: Secondary | ICD-10-CM | POA: Diagnosis not present

## 2021-05-16 DIAGNOSIS — R768 Other specified abnormal immunological findings in serum: Secondary | ICD-10-CM | POA: Diagnosis not present

## 2021-05-16 DIAGNOSIS — Z79899 Other long term (current) drug therapy: Secondary | ICD-10-CM

## 2021-05-16 MED ORDER — FLUOXETINE HCL 20 MG PO TABS
ORAL_TABLET | ORAL | 3 refills | Status: DC
  Filled 2021-05-16: qty 90, 90d supply, fill #0
  Filled 2021-09-07: qty 60, 60d supply, fill #1

## 2021-05-16 MED ORDER — TRIAMTERENE-HCTZ 37.5-25 MG PO CAPS
1.0000 | ORAL_CAPSULE | Freq: Every day | ORAL | 5 refills | Status: DC
  Filled 2021-05-16: qty 30, 30d supply, fill #0
  Filled 2021-06-09: qty 30, 30d supply, fill #1
  Filled 2021-07-17: qty 30, 30d supply, fill #2
  Filled 2021-09-07: qty 30, 30d supply, fill #3
  Filled 2021-10-23: qty 30, 30d supply, fill #4

## 2021-05-16 NOTE — Progress Notes (Signed)
[facility]  PATIENT: Laurie Cole DOB: Apr 20, 1976  REFERRING CLINICIAN: PCP is Penni Homans   REASON FOR VISIT: MS   HISTORICAL  CHIEF COMPLAINT:  Chief Complaint  Patient presents with   Follow-up    RM 12, alone. Last seen 11/09/2020. On vumerity for MS. Noticed a lot of weight gain/swelling. Denies any pain w/ swelling. Concerned about this. Saw PCP who did a work up. RA negative. Wanted to send her to check for lupus but she declined. She thinks it may be combination of going off birth control (Laurie Cole) and weight gain. Concerned about affect it may have on her MS. Would like to discuss. Wants to ask about appetite suppressant.     HISTORY OF PRESENT ILLNESS:  Laurie Cole is a 45 y.o. woman with relapsing remitting MS diagnosed in 2006.     Update 05/16/2021: She is taking Vumerity 231 mg po bid.  With higher dose (standard 462 bid or 462-231 mg) she had lower lymphocyte count (even 231-462 mg). Lymphocytes were 0.7 2 months ago.     She tolerates it well with no GI issues.      She has had more weight gain - 12-15 pounds the last 6 months.    She notes ankle edema on the left and also notse swelling in her hands (harder to put rings on).   No pain in legs.     She stopped her OCP.   She has a positive ANA (nuclear/homogenous 1:80 and nuclear/nucleolar 1:320).         She is doing well with her MS and has no new symptoms.   No recent exacerbation.   MRIs have been stable (last one 11/24/2018).    Brain volume is good.   Mostly periventricular with just a couple juxtacortical foci.     Gait, strength, sensation are doing well.   Fatigue and Attention deficit have done well on Adderall.  She stays active but has gained a few pounds.     Her father has ankylosing spondylosis and aunt has SLE.      MS History:  She presented with a Lhermite sign, visual changes and numbness in 2005. MRIs were performed that were consistent with multiple sclerosis.  Additionally, visual evoked potentials showed slowing of the P100. She was placed on Betaseron. She stopped when she was pregnant about 2 years later. She returned to Betaseron for about another year and one half after her son was born. Then she had some breakthrough relapses and she was switched to Tysabri and tolerated it very well. Unfortunately, after a couple years she converted from negative to positive at a high titer.   She did not have any exacerbations while on Tysabri. She then switched to Tecfidera around 2014.  Initially she had some GI issues as she had persistent flushing.  Therefore, in 2019 she switched to Vumerity and the flushing improved.  However, due to low lymphocytes the dose had to be reduced in 2020  Imaging: MRI of the brain 11/24/2018 shows T2/flair hyperintense foci in the hemispheres, right cerebellar hemisphere and possibly the right pons in a pattern and configuration consistent with chronic demyelinating plaque associated with multiple sclerosis.  None of the foci appears to be acute and they do not enhance.  When compared to the MRI dated 01/25/2018, there is no interval change  MRI of the cervical spine 12/02/2004 showed a posterior T2 hyperintense focus adjacent to C3.  REVIEW OF SYSTEMS:  Constitutional: No fevers,  chills, sweats, or change in appetite.  Notes  Fatigue Eyes: No visual changes, double vision, eye pain Ear, nose and throat: No hearing loss, ear pain, nasal congestion, sore throat Cardiovascular: No chest pain, palpitations Respiratory:  No shortness of breath at rest or with exertion.   No wheezes GastrointestinaI: No nausea, vomiting, diarrhea, abdominal pain, fecal incontinence Genitourinary:  No dysuria, urinary retention or frequency.  No nocturia. Musculoskeletal:  No neck pain, back pain.   Reports a muscle spasm sensation in the right hip. Integumentary: No rash, pruritus, skin lesions Neurological: as above Psychiatric: Notes  depression at  this time.  No anxiety Endocrine: No palpitations, diaphoresis, change in appetite, change in weigh or increased thirst Hematologic/Lymphatic:  No anemia, purpura, petechiae. Allergic/Immunologic: No itchy/runny eyes, nasal congestion, recent allergic reactions, rashes  ALLERGIES: No Known Allergies  HOME MEDICATIONS:  Current Outpatient Medications:    amphetamine-dextroamphetamine (ADDERALL) 10 MG tablet, TAKE 1 TABLET BY MOUTH 3 TIMES DAILY AS NEEDED., Disp: 90 tablet, Rfl: 0   cholecalciferol (VITAMIN D) 1000 units tablet, Take 5,000 Units by mouth daily., Disp: , Rfl:    Diroximel Fumarate (VUMERITY) 231 MG CPDR, Take 2 capsules by mouth 2 (two) times daily. , Disp: , Rfl:    triamterene-hydrochlorothiazide (DYAZIDE) 37.5-25 MG capsule, Take  1 capsule  by mouth daily., Disp: 30 capsule, Rfl: 5   FLUoxetine (PROZAC) 20 MG tablet, Take 1/2 to 1 TABLET daily, Disp: 90 tablet, Rfl: 3   PAST MEDICAL HISTORY: Past Medical History:  Diagnosis Date   Depression    MS (multiple sclerosis) ([place])    sees Dr Kerman Passey in [facility]    PAST SURGICAL HISTORY: Past Surgical History:  Procedure Laterality Date   LIPOSUCTION      FAMILY HISTORY: Family History  Problem Relation Age of Onset   Drug abuse Mother        opiod addiction   Arthritis Mother        back pain   Arthritis Father    Arthritis/Rheumatoid Father    Ankylosing spondylitis Father    Seizures Maternal Aunt    Parkinsonism Maternal Grandfather    Diabetes Maternal Grandfather    Lupus Maternal Aunt    Mental illness Brother        bipolar disorder   Parkinson's disease Paternal Grandmother    Arthritis Sister    Mental illness Sister    Drug abuse Sister    Breast cancer Paternal Aunt     SOCIAL HISTORY:  Social History   Socioeconomic History   Marital status: Married    Spouse name: Laurie Cole   Number of children: 1   Years of education: 18   Highest education level: Not on file  Occupational History    Occupation: Education officer, museum  Tobacco Use   Smoking status: Never   Smokeless tobacco: Never  Substance and Sexual Activity   Alcohol use: Yes    Alcohol/week: 1.0 standard drink    Types: 1 Standard drinks or equivalent per week   Drug use: No   Sexual activity: Yes    Partners: Male    Birth control/protection: Surgical  Other Topics Concern   Not on file  Social History Narrative   Lives with husband and son, works as Education officer, museum, no dietary restrictions, exercises intermittently. Wears a seat belt regularly   Social Determinants of Health   Financial Resource Strain: Not on file  Food Insecurity: Not on file  Transportation Needs: Not on file  Physical Activity:  Not on file  Stress: Not on file  Social Connections: Not on file  Intimate Partner Violence: Not on file     PHYSICAL EXAM  Vitals:   05/16/21 1249  BP: 121/81  Pulse: 98  SpO2: 99%  Weight: 175 lb 8 oz (79.6 kg)  Height: 5\' 3"  (1.6 m)    Body mass index is 31.09 kg/m.   General: The patient is well-developed and well-nourished and in no acute distress.  There is 2+ pedal edema.  Neck: She has mild tenderness over the right occiput.  Neurologic Exam  Mental status: The patient is alert and oriented x 3 at the time of the examination. The patient has apparent normal recent and remote memory, with an apparently normal attention span and concentration ability.   Speech is normal.  Cranial nerves: Extraocular movements are full.  Facial strength is normal.   No dysarthria.   No obvious hearing deficits are noted.  Motor:  There are no fasciculations.  Muscle bulk, tone and strength are normal.  Sensory: She has ntact sensation to touch and vibration in the arms and legs.  Coordination: Cerebellar testing reveals good finger-nose-finger and heel-to-shin bilaterally.  Gait and station: Station and gait are normal.  Romberg is negative.  Reflexes: Deep tendon reflexes are mildly increased in the  legs.. No ankle clonus.  _____________________________________________  Multiple sclerosis ([place]) - Plan: CBC with Differential/Platelet  Positive ANA (antinuclear antibody) - Plan: ANA w/Reflex  Anxiety  High risk medication use - Plan: CBC with Differential/Platelet  Lymphopenia - Plan: ANA w/Reflex  Pedal edema  1.   She will continue Vumerity.231 mg po bid (half dose).  . We will recheck a CBC with differential .   2.   Stay active and exercise as tolerated.   Trial of a diuretic for edema.    3.    Continue Adderall.  She does not need a refill at this point. 4.    She had an abnormal ANA.  It reflects panel was not performed.  We will recheck this.  Trial of Dyazide for edema.  5.  Return in 6 months or sooner if there are new or worsening neurologic symptoms.  Georgie Haque A. Felecia Shelling, MD, PhD 7/78/2423, 5:36 PM Certified in Neurology, Clinical Neurophysiology, Sleep Medicine, Pain Medicine and Neuroimaging  [facility] [address], [place], [place] [address] [phone]

## 2021-05-17 LAB — CBC WITH DIFFERENTIAL/PLATELET
Basophils Absolute: 0.1 10*3/uL (ref 0.0–0.2)
Basos: 1 %
EOS (ABSOLUTE): 0.1 10*3/uL (ref 0.0–0.4)
Eos: 2 %
Hematocrit: 40.4 % (ref 34.0–46.6)
Hemoglobin: 13.9 g/dL (ref 11.1–15.9)
Immature Grans (Abs): 0 10*3/uL (ref 0.0–0.1)
Immature Granulocytes: 0 %
Lymphocytes Absolute: 0.7 10*3/uL (ref 0.7–3.1)
Lymphs: 15 %
MCH: 33.7 pg — ABNORMAL HIGH (ref 26.6–33.0)
MCHC: 34.4 g/dL (ref 31.5–35.7)
MCV: 98 fL — ABNORMAL HIGH (ref 79–97)
Monocytes Absolute: 0.3 10*3/uL (ref 0.1–0.9)
Monocytes: 6 %
Neutrophils Absolute: 3.6 10*3/uL (ref 1.4–7.0)
Neutrophils: 76 %
Platelets: 288 10*3/uL (ref 150–450)
RBC: 4.12 x10E6/uL (ref 3.77–5.28)
RDW: 13.1 % (ref 11.7–15.4)
WBC: 4.8 10*3/uL (ref 3.4–10.8)

## 2021-05-17 LAB — ANA W/REFLEX: Anti Nuclear Antibody (ANA): NEGATIVE

## 2021-06-06 ENCOUNTER — Other Ambulatory Visit (HOSPITAL_BASED_OUTPATIENT_CLINIC_OR_DEPARTMENT_OTHER): Payer: Self-pay

## 2021-06-06 ENCOUNTER — Other Ambulatory Visit: Payer: Self-pay | Admitting: *Deleted

## 2021-06-06 MED ORDER — AMPHETAMINE-DEXTROAMPHETAMINE 10 MG PO TABS
ORAL_TABLET | Freq: Three times a day (TID) | ORAL | 0 refills | Status: DC | PRN
  Filled 2021-06-06: qty 90, 30d supply, fill #0

## 2021-06-09 ENCOUNTER — Other Ambulatory Visit (HOSPITAL_BASED_OUTPATIENT_CLINIC_OR_DEPARTMENT_OTHER): Payer: Self-pay

## 2021-07-13 ENCOUNTER — Other Ambulatory Visit: Payer: Self-pay | Admitting: Neurology

## 2021-07-13 ENCOUNTER — Other Ambulatory Visit (HOSPITAL_BASED_OUTPATIENT_CLINIC_OR_DEPARTMENT_OTHER): Payer: Self-pay

## 2021-07-13 MED ORDER — AMPHETAMINE-DEXTROAMPHETAMINE 10 MG PO TABS
ORAL_TABLET | Freq: Three times a day (TID) | ORAL | 0 refills | Status: DC | PRN
  Filled 2021-07-13: qty 90, 30d supply, fill #0

## 2021-07-14 ENCOUNTER — Other Ambulatory Visit (HOSPITAL_BASED_OUTPATIENT_CLINIC_OR_DEPARTMENT_OTHER): Payer: Self-pay

## 2021-07-17 ENCOUNTER — Other Ambulatory Visit (HOSPITAL_BASED_OUTPATIENT_CLINIC_OR_DEPARTMENT_OTHER): Payer: Self-pay

## 2021-07-17 ENCOUNTER — Encounter: Payer: Self-pay | Admitting: *Deleted

## 2021-07-17 NOTE — Telephone Encounter (Signed)
Submitted PA via new insurance on CMM. KeyRS:[id number]. Waiting on determination from [facility].

## 2021-07-20 NOTE — Telephone Encounter (Signed)
Received following response on CMM; "CaseId:[id number];Status:Cancelled;Explanation:This medication has been approved. Coverage end date for the drug:07/20/2023;"

## 2021-09-07 ENCOUNTER — Other Ambulatory Visit: Payer: Self-pay | Admitting: Neurology

## 2021-09-08 ENCOUNTER — Other Ambulatory Visit (HOSPITAL_BASED_OUTPATIENT_CLINIC_OR_DEPARTMENT_OTHER): Payer: Self-pay

## 2021-09-11 ENCOUNTER — Other Ambulatory Visit (HOSPITAL_BASED_OUTPATIENT_CLINIC_OR_DEPARTMENT_OTHER): Payer: Self-pay

## 2021-09-11 MED ORDER — AMPHETAMINE-DEXTROAMPHETAMINE 10 MG PO TABS
ORAL_TABLET | Freq: Three times a day (TID) | ORAL | 0 refills | Status: DC | PRN
  Filled 2021-09-11: qty 90, 30d supply, fill #0

## 2021-09-11 NOTE — Telephone Encounter (Signed)
Received refill request for Adderall.  Last OV was on 05/16/21.  Next OV has not been scheduled .  Last RX was written on 07/14/21 for 90 tabs.   [place] [facility] has been reviewed.

## 2021-09-15 ENCOUNTER — Other Ambulatory Visit: Payer: Self-pay | Admitting: Medical

## 2021-09-15 ENCOUNTER — Ambulatory Visit
Admission: RE | Admit: 2021-09-15 | Discharge: 2021-09-15 | Disposition: A | Discharge status: Home / Self Care | Payer: BC Managed Care – PPO | Source: Ambulatory Visit | Attending: Medical | Admitting: Medical

## 2021-09-15 ENCOUNTER — Other Ambulatory Visit: Payer: Self-pay

## 2021-09-15 DIAGNOSIS — R928 Other abnormal and inconclusive findings on diagnostic imaging of breast: Secondary | ICD-10-CM

## 2021-09-15 DIAGNOSIS — N6310 Unspecified lump in the right breast, unspecified quadrant: Secondary | ICD-10-CM

## 2021-10-23 ENCOUNTER — Other Ambulatory Visit: Payer: Self-pay | Admitting: Neurology

## 2021-10-23 ENCOUNTER — Other Ambulatory Visit (HOSPITAL_BASED_OUTPATIENT_CLINIC_OR_DEPARTMENT_OTHER): Payer: Self-pay

## 2021-10-23 MED ORDER — AMPHETAMINE-DEXTROAMPHETAMINE 10 MG PO TABS
ORAL_TABLET | Freq: Three times a day (TID) | ORAL | 0 refills | Status: DC | PRN
  Filled 2021-10-23: qty 90, 30d supply, fill #0

## 2021-10-23 NOTE — Telephone Encounter (Signed)
Received refill request for Adderall.  Last OV was on 05/16/21.  Next OV has not been scheduled.  Last RX was written on 09/11/21 for 90 tabs.   [place] [facility] has been reviewed.

## 2021-10-25 ENCOUNTER — Other Ambulatory Visit (HOSPITAL_BASED_OUTPATIENT_CLINIC_OR_DEPARTMENT_OTHER): Payer: Self-pay

## 2021-10-25 ENCOUNTER — Encounter: Payer: Self-pay | Admitting: Neurology

## 2021-10-25 ENCOUNTER — Other Ambulatory Visit: Payer: Self-pay | Admitting: *Deleted

## 2021-10-25 ENCOUNTER — Other Ambulatory Visit: Payer: Self-pay | Admitting: Neurology

## 2021-10-25 DIAGNOSIS — Z79899 Other long term (current) drug therapy: Secondary | ICD-10-CM

## 2021-10-25 DIAGNOSIS — G35 Multiple sclerosis: Secondary | ICD-10-CM

## 2021-10-25 MED ORDER — AMPHETAMINE-DEXTROAMPHETAMINE 10 MG PO TABS: ORAL_TABLET | Freq: Three times a day (TID) | ORAL | 0 refills | Status: DC | PRN

## 2021-10-27 ENCOUNTER — Other Ambulatory Visit (HOSPITAL_BASED_OUTPATIENT_CLINIC_OR_DEPARTMENT_OTHER): Payer: Self-pay

## 2021-11-20 ENCOUNTER — Other Ambulatory Visit: Payer: Self-pay | Admitting: Neurology

## 2021-12-04 ENCOUNTER — Other Ambulatory Visit: Payer: Self-pay | Admitting: Neurology

## 2021-12-18 ENCOUNTER — Telehealth: Payer: Self-pay

## 2021-12-18 NOTE — Telephone Encounter (Signed)
PA for vumerity has been sent. VUMERITY 231MG  delayed-release capsules     Status: Sent to Plan  Created: January 13th, 2023  Sent: January 16th, 2023 Determination should be made within 5 business days.

## 2021-12-21 NOTE — Telephone Encounter (Signed)
PA was unable to be completed pt insurance could not process claim.  I have reached out to the pt on her insurance cards for the 2023 year.

## 2021-12-25 NOTE — Telephone Encounter (Signed)
Pt's insurance has to [place] [employer] [place]. I called and initiated verbal PA and received instant approval.   PA has been approved from 12/25/21-12/04/22. # 120 for a 30 day supply.  Case ID [id number] Confirmation ID  [id number].  Pt notified of PA approval through mychart.

## 2021-12-27 ENCOUNTER — Other Ambulatory Visit: Payer: Self-pay | Admitting: *Deleted

## 2021-12-27 ENCOUNTER — Encounter: Payer: Self-pay | Admitting: Neurology

## 2021-12-27 DIAGNOSIS — G35 Multiple sclerosis: Secondary | ICD-10-CM

## 2021-12-27 MED ORDER — VUMERITY 231 MG PO CPDR: 2.0000 | DELAYED_RELEASE_CAPSULE | Freq: Two times a day (BID) | ORAL | 1 refills | Status: DC

## 2021-12-29 ENCOUNTER — Other Ambulatory Visit: Payer: Self-pay | Admitting: Neurology

## 2022-01-01 ENCOUNTER — Other Ambulatory Visit: Payer: Self-pay | Admitting: *Deleted

## 2022-01-01 DIAGNOSIS — G35 Multiple sclerosis: Secondary | ICD-10-CM

## 2022-01-04 ENCOUNTER — Telehealth: Payer: Self-pay | Admitting: Neurology

## 2022-01-04 NOTE — Telephone Encounter (Signed)
[employer] Josem Kaufmann: [id number] (exp. 01/04/22 to 03/04/22) order sent to [facility], they will reach out to the patient to schedule.

## 2022-01-22 ENCOUNTER — Other Ambulatory Visit: Payer: BC Managed Care – PPO

## 2022-01-28 ENCOUNTER — Other Ambulatory Visit: Payer: Self-pay

## 2022-01-28 ENCOUNTER — Ambulatory Visit
Admission: RE | Admit: 2022-01-28 | Discharge: 2022-01-28 | Disposition: A | Discharge status: Home / Self Care | Payer: BC Managed Care – PPO | Source: Ambulatory Visit | Attending: Neurology | Admitting: Neurology

## 2022-01-28 DIAGNOSIS — G35 Multiple sclerosis: Secondary | ICD-10-CM

## 2022-01-28 MED ORDER — GADOBENATE DIMEGLUMINE 529 MG/ML IV SOLN
15.0000 mL | Freq: Once | INTRAVENOUS | Status: AC | PRN
  Administered 2022-01-28: 15 mL via INTRAVENOUS

## 2022-02-04 ENCOUNTER — Other Ambulatory Visit: Payer: Self-pay | Admitting: Neurology

## 2022-02-05 MED ORDER — AMPHETAMINE-DEXTROAMPHETAMINE 10 MG PO TABS: ORAL_TABLET | Freq: Three times a day (TID) | ORAL | 0 refills | Status: DC | PRN

## 2022-02-05 NOTE — Telephone Encounter (Signed)
Received refill request for Adderall.  ?Last OV was on 05/16/21.  ?Next OV is scheduled for 02/12/22 .  ?Last RX was written on 10/25/21 for 90 tabs.  ? ?[place] [facility] Database has been reviewed.  ?

## 2022-02-07 ENCOUNTER — Other Ambulatory Visit: Payer: Self-pay | Admitting: *Deleted

## 2022-02-07 ENCOUNTER — Other Ambulatory Visit (HOSPITAL_BASED_OUTPATIENT_CLINIC_OR_DEPARTMENT_OTHER): Payer: Self-pay

## 2022-02-07 ENCOUNTER — Encounter: Payer: Self-pay | Admitting: Neurology

## 2022-02-07 MED ORDER — AMPHETAMINE-DEXTROAMPHETAMINE 5 MG PO TABS
10.0000 mg | ORAL_TABLET | Freq: Three times a day (TID) | ORAL | 0 refills | Status: DC | PRN
  Filled 2022-02-07: qty 180, 30d supply, fill #0

## 2022-02-08 ENCOUNTER — Other Ambulatory Visit (HOSPITAL_BASED_OUTPATIENT_CLINIC_OR_DEPARTMENT_OTHER): Payer: Self-pay

## 2022-02-12 ENCOUNTER — Other Ambulatory Visit: Payer: Self-pay | Admitting: Neurology

## 2022-02-12 ENCOUNTER — Encounter: Payer: Self-pay | Admitting: Neurology

## 2022-02-12 ENCOUNTER — Ambulatory Visit (INDEPENDENT_AMBULATORY_CARE_PROVIDER_SITE_OTHER): Payer: BC Managed Care – PPO | Admitting: Neurology

## 2022-02-12 VITALS — BP 116/82 | HR 104 | Ht 63.0 in | Wt 168.0 lb

## 2022-02-12 DIAGNOSIS — R7689 Other specified abnormal immunological findings in serum: Secondary | ICD-10-CM

## 2022-02-12 DIAGNOSIS — F419 Anxiety disorder, unspecified: Secondary | ICD-10-CM

## 2022-02-12 DIAGNOSIS — G35D Multiple sclerosis, unspecified: Secondary | ICD-10-CM

## 2022-02-12 DIAGNOSIS — G35 Multiple sclerosis: Secondary | ICD-10-CM

## 2022-02-12 DIAGNOSIS — R768 Other specified abnormal immunological findings in serum: Secondary | ICD-10-CM

## 2022-02-12 DIAGNOSIS — Z79899 Other long term (current) drug therapy: Secondary | ICD-10-CM

## 2022-02-12 MED ORDER — FLUOXETINE HCL 20 MG PO TABS: ORAL_TABLET | ORAL | 3 refills | Status: DC

## 2022-02-12 NOTE — Progress Notes (Signed)
[facility]  PATIENT: Laurie Cole DOB: [birth year]  REFERRING CLINICIAN: PCP is Penni Homans   REASON FOR VISIT: MS   HISTORICAL  CHIEF COMPLAINT:  Chief Complaint  Patient presents with   Follow-up    Rm 2, alone. Last seen 05/16/21. Here for MS f/u, on Vumerity. Pt reports doing well today.     HISTORY OF PRESENT ILLNESS:  Laurie Cole is a 46 y.o. woman with relapsing remitting MS diagnosed in 2006.     Update 02/12/2022 She is taking Vumerity 231 mg po bid.  With higher dose (standard 462 bid or 462-231 mg) she had lower lymphocyte count (even 231-462 mg).  On the lower dose, lymphocyte count has been 0.7 or 0.8.Marland Kitchen     She has not had GI issues.  She does get intermittent flushing and feels more tired when that occurs.  She has had some issues with compliance partially due to the flushing and would like to discuss other medical treatments.  She had done best on Tysabri but unfortunately became JCV antibody positive  She has a positive ANA (nuclear/homogenous 1:80 and nuclear/nucleolar 1:320).       No specific rheumatologic disorder has been diagnosed.  She denies any new physical neurologic symptoms or recent exacerbation.   MRIs have been stable (01/28/2022).    Brain volume is good.   The MRIs show predominantly periventricular foci with just a couple juxtacortical foci.   No brainstem or cerebellar lesions.  In the past MRI has shown a cervical spine lesion at C3  Gait, strength, sensation are doing well.   Fatigue and Attention deficit have done well on Adderall.  Mood is doing well on fluoxetine.  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      MS History:  She presented with a Lhermite sign, visual changes and numbness in 2005. MRIs were performed that  were consistent with multiple sclerosis. Additionally, visual evoked potentials showed slowing of the P100. She was placed on Betaseron. She stopped when she was pregnant about 2 years later. She returned to Betaseron for about another year and one half after her son was born. Then she had some breakthrough relapses and she was switched to Tysabri and tolerated it very well. Unfortunately, after a couple years she converted from negative to positive at a high titer.   She did not have any exacerbations while on Tysabri. She then switched to Tecfidera around 2014.  Initially she had some GI issues as she had persistent flushing.  Therefore, in 2019 she switched to Vumerity and the flushing improved.  However, due to low lymphocytes the dose had to be reduced in 2020  Imaging: MRI of the brain 01/28/2022 shows  Multiple T2/FLAIR hyperintense foci in the periventricular, juxtacortical and deep white matter of the hemispheres.  Foci are also noted in the right cerebellar hemisphere adjacent to the fourth ventricle and to the right within the pons.  The pattern is consistent with chronic demyelinating plaque associated with multiple sclerosis.  None of the foci enhanced or appear to be acute.  Compared to the MRI from 01/10/2021, there were no new lesions.  MRI of the brain 11/24/2018 shows T2/flair hyperintense foci in the hemispheres, right cerebellar hemisphere and possibly the right pons in a pattern and  configuration consistent with chronic demyelinating plaque associated with multiple sclerosis.  None of the foci appears to be acute and they do not enhance.  When compared to the MRI dated 01/25/2018, there is no interval change  MRI of the cervical spine 12/02/2004 showed a posterior T2 hyperintense focus adjacent to C3.  REVIEW OF SYSTEMS:  Constitutional: No fevers, chills, sweats, or change in appetite.  Notes  Fatigue Eyes: No visual changes, double vision, eye pain Ear, nose and throat: No hearing  loss, ear pain, nasal congestion, sore throat Cardiovascular: No chest pain, palpitations Respiratory:  No shortness of breath at rest or with exertion.   No wheezes GastrointestinaI: No nausea, vomiting, diarrhea, abdominal pain, fecal incontinence Genitourinary:  No dysuria, urinary retention or frequency.  No nocturia. Musculoskeletal:  No neck pain, back pain.   Reports a muscle spasm sensation in the right hip. Integumentary: No rash, pruritus, skin lesions Neurological: as above Psychiatric: Notes  depression at this time.  No anxiety Endocrine: No palpitations, diaphoresis, change in appetite, change in weigh or increased thirst Hematologic/Lymphatic:  No anemia, purpura, petechiae. Allergic/Immunologic: No itchy/runny eyes, nasal congestion, recent allergic reactions, rashes  ALLERGIES: No Known Allergies  HOME MEDICATIONS:  Current Outpatient Medications:    amphetamine-dextroamphetamine (ADDERALL) 5 MG tablet, Take 2 tablets (10 mg total) by mouth 3 (three) times daily as needed., Disp: 180 tablet, Rfl: 0   cholecalciferol (VITAMIN D) 1000 units tablet, Take 5,000 Units by mouth daily., Disp: , Rfl:    triamterene-hydrochlorothiazide (DYAZIDE) 37.5-25 MG capsule, Take 1 each (1 capsule total) by mouth daily. Please call [phone] and make overdue appt for further refills, Disp: 30 capsule, Rfl: 0   VUMERITY 231 MG CPDR, Take 2 capsules by mouth 2 (two) times daily., Disp: 360 capsule, Rfl: 1   FLUoxetine (PROZAC) 20 MG tablet, Take 1/2 to 1 TABLET daily, Disp: 90 tablet, Rfl: 3   PAST MEDICAL HISTORY: Past Medical History:  Diagnosis Date   Depression    MS (multiple sclerosis) ([place])    sees Dr Kerman Passey in [facility]    PAST SURGICAL HISTORY: Past Surgical History:  Procedure Laterality Date   LIPOSUCTION      FAMILY HISTORY: Family History  Problem Relation Age of Onset   Drug abuse Mother        opiod addiction   Arthritis Mother        back pain   Arthritis Father     Arthritis/Rheumatoid Father    Ankylosing spondylitis Father    Seizures Maternal Aunt    Parkinsonism Maternal Grandfather    Diabetes Maternal Grandfather    Lupus Maternal Aunt    Mental illness Brother        bipolar disorder   Parkinson's disease Paternal Grandmother    Arthritis Sister    Mental illness Sister    Drug abuse Sister    Breast cancer Paternal Aunt     SOCIAL HISTORY:  Social History   Socioeconomic History   Marital status: Married    Spouse name: Jeneen Rinks   Number of children: 1   Years of education: 18   Highest education level: Not on file  Occupational History   Occupation: Education officer, museum  Tobacco Use   Smoking status: Never   Smokeless tobacco: Never  Substance and Sexual Activity   Alcohol use: Yes    Alcohol/week: 1.0 standard drink    Types: 1 Standard drinks or equivalent per week   Drug use: No   Sexual activity: Yes  Partners: Male    Birth control/protection: Surgical  Other Topics Concern   Not on file  Social History Narrative   Lives with husband and son, works as Education officer, museum, no dietary restrictions, exercises intermittently. Wears a seat belt regularly   Social Determinants of Health   Financial Resource Strain: Not on file  Food Insecurity: Not on file  Transportation Needs: Not on file  Physical Activity: Not on file  Stress: Not on file  Social Connections: Not on file  Intimate Partner Violence: Not on file     PHYSICAL EXAM  Vitals:   02/12/22 1417  BP: 116/82  Pulse: (!) 104  Weight: 168 lb (76.2 kg)  Height: '5\' 3"'$  (1.6 m)    Body mass index is 29.76 kg/m.   General: The patient is well-developed and well-nourished and in no acute distress.  There is 2+ pedal edema.  Neck: She has mild tenderness over the right occiput.  Neurologic Exam  Mental status: The patient is alert and oriented x 3 at the time of the examination. The patient has apparent normal recent and remote memory, with an apparently  normal attention span and concentration ability.   Speech is normal.  Cranial nerves: Extraocular movements are full.  Facial strength is normal.   No dysarthria.   No obvious hearing deficits are noted.  Motor:  There are no fasciculations.  Muscle bulk, tone and strength are normal.  Sensory: She has ntact sensation to touch and vibration in the arms and legs.  Coordination: Cerebellar testing reveals good finger-nose-finger and heel-to-shin bilaterally.  Gait and station: The station, gait and tandem gait are normal.  Romberg is negative.  Reflexes: Deep tendon reflexes are mildly increased in the legs.. No ankle clonus.  _____________________________________________  Multiple sclerosis ([place]) - Plan: Hepatitis B surface antigen, HIV Antibody (routine testing w rflx), IgG, IgA, IgM, QuantiFERON-TB Gold Plus, Varicella zoster antibody, IgG, Hepatitis B core antibody, total, Hepatitis C antibody, Hepatitis B surface antibody,qualitative, Hepatic function panel, CBC with Differential/Platelet  High risk medication use - Plan: Hepatitis B surface antigen, HIV Antibody (routine testing w rflx), IgG, IgA, IgM, QuantiFERON-TB Gold Plus, Varicella zoster antibody, IgG, Hepatitis B core antibody, total, Hepatitis C antibody, Hepatitis B surface antibody,qualitative, Hepatic function panel, CBC with Differential/Platelet  Positive ANA (antinuclear antibody)  Anxiety  1.   We discussed other treatment options as she is having flushing and some compliance issues and low lymphocyte counts with Vumerity..  She is most interested in [place] and we will check lab work for chronic infections 2.   Stay active and exercise as tolerated.    3.    Continue Adderall.  Continue fluoxetine 4.    Return in 6 months or sooner if there are new or worsening neurologic symptoms.  Lowery Paullin A. Felecia Shelling, MD, PhD 4/43/1540, 0:86 PM Certified in Neurology, Clinical Neurophysiology, Sleep Medicine, Pain Medicine and  Neuroimaging  [facility] [address] [place], [place] [address] [phone]

## 2022-02-14 ENCOUNTER — Telehealth: Payer: Self-pay | Admitting: Neurology

## 2022-02-14 ENCOUNTER — Encounter: Payer: Self-pay | Admitting: Neurology

## 2022-02-14 LAB — CBC WITH DIFFERENTIAL/PLATELET
Basophils Absolute: 0.1 10*3/uL (ref 0.0–0.2)
Basos: 1 %
EOS (ABSOLUTE): 0.1 10*3/uL (ref 0.0–0.4)
Eos: 2 %
Hematocrit: 40.2 % (ref 34.0–46.6)
Hemoglobin: 14.1 g/dL (ref 11.1–15.9)
Immature Grans (Abs): 0 10*3/uL (ref 0.0–0.1)
Immature Granulocytes: 0 %
Lymphocytes Absolute: 1 10*3/uL (ref 0.7–3.1)
Lymphs: 14 %
MCH: 34.4 pg — ABNORMAL HIGH (ref 26.6–33.0)
MCHC: 35.1 g/dL (ref 31.5–35.7)
MCV: 98 fL — ABNORMAL HIGH (ref 79–97)
Monocytes Absolute: 0.4 10*3/uL (ref 0.1–0.9)
Monocytes: 6 %
Neutrophils Absolute: 5.9 10*3/uL (ref 1.4–7.0)
Neutrophils: 77 %
Platelets: 301 10*3/uL (ref 150–450)
RBC: 4.1 x10E6/uL (ref 3.77–5.28)
RDW: 12.8 % (ref 11.7–15.4)
WBC: 7.6 10*3/uL (ref 3.4–10.8)

## 2022-02-14 LAB — HEPATITIS B SURFACE ANTIBODY,QUALITATIVE: Hep B Surface Ab, Qual: NONREACTIVE

## 2022-02-14 LAB — QUANTIFERON-TB GOLD PLUS
QuantiFERON Mitogen Value: 2.79 IU/mL
QuantiFERON Nil Value: 0 IU/mL
QuantiFERON TB1 Ag Value: 0.01 IU/mL
QuantiFERON TB2 Ag Value: 0 IU/mL
QuantiFERON-TB Gold Plus: NEGATIVE

## 2022-02-14 LAB — HEPATIC FUNCTION PANEL
ALT: 15 IU/L (ref 0–32)
AST: 21 IU/L (ref 0–40)
Albumin: 4.8 g/dL (ref 3.8–4.8)
Alkaline Phosphatase: 58 IU/L (ref 44–121)
Bilirubin Total: 0.6 mg/dL (ref 0.0–1.2)
Bilirubin, Direct: 0.17 mg/dL (ref 0.00–0.40)
Total Protein: 7.6 g/dL (ref 6.0–8.5)

## 2022-02-14 LAB — HEPATITIS C ANTIBODY: Hep C Virus Ab: NONREACTIVE

## 2022-02-14 LAB — VARICELLA ZOSTER ANTIBODY, IGG: Varicella zoster IgG: 1180 index (ref >165)

## 2022-02-14 LAB — IGG, IGA, IGM
IgA/Immunoglobulin A, Serum: 236 mg/dL (ref 87–352)
IgG (Immunoglobin G), Serum: 1169 mg/dL (ref 586–1602)
IgM (Immunoglobulin M), Srm: 222 mg/dL — ABNORMAL HIGH (ref 26–217)

## 2022-02-14 LAB — HEPATITIS B CORE ANTIBODY, TOTAL: Hep B Core Total Ab: NEGATIVE

## 2022-02-14 LAB — HIV ANTIBODY (ROUTINE TESTING W REFLEX): HIV Screen 4th Generation wRfx: NONREACTIVE

## 2022-02-14 LAB — HEPATITIS B SURFACE ANTIGEN: Hepatitis B Surface Ag: NEGATIVE

## 2022-02-14 NOTE — Telephone Encounter (Signed)
Completed the Ocrevus start form and faxed that and insurance to [place]. Once Dr Felecia Shelling signs will provide this information along with the infusion order for to intrafusion.  ?

## 2022-02-19 NOTE — Telephone Encounter (Signed)
Ocrevus order has been given to the Infusion Suite on 02/14/22. ?

## 2022-02-23 ENCOUNTER — Encounter: Payer: Self-pay | Admitting: Neurology

## 2022-02-26 ENCOUNTER — Encounter: Payer: Self-pay | Admitting: Neurology

## 2022-02-26 ENCOUNTER — Other Ambulatory Visit: Payer: Self-pay | Admitting: *Deleted

## 2022-02-26 MED ORDER — FLUOXETINE HCL 20 MG PO CAPS: 20.0000 mg | ORAL_CAPSULE | Freq: Every day | ORAL | 3 refills | Status: DC

## 2022-02-26 NOTE — Telephone Encounter (Signed)
Called pt. She is asking for Prozac '20mg'$  capsule sent to [facility] in [place], [place]. She will use coupon to get for 10.00.  ?She was taking 1 cap per day. [facility] does not have tablet in stock. ? ?I called [facility] to cx rx Prozac tablet on file there. Spoke w/ tech. States rx already transferred to [facility]. I called [facility]. Cx tablet on file. Advised capsule should be filled instead. Spoke w/ Donetta Potts.  ?

## 2022-03-02 ENCOUNTER — Other Ambulatory Visit: Payer: Self-pay | Admitting: Neurology

## 2022-03-19 ENCOUNTER — Other Ambulatory Visit: Payer: BC Managed Care – PPO

## 2022-03-26 ENCOUNTER — Other Ambulatory Visit: Payer: Self-pay | Admitting: Neurology

## 2022-03-26 ENCOUNTER — Other Ambulatory Visit: Payer: Self-pay | Admitting: *Deleted

## 2022-03-26 ENCOUNTER — Encounter: Payer: Self-pay | Admitting: Neurology

## 2022-03-26 ENCOUNTER — Other Ambulatory Visit (HOSPITAL_BASED_OUTPATIENT_CLINIC_OR_DEPARTMENT_OTHER): Payer: Self-pay

## 2022-03-26 MED ORDER — AMPHETAMINE-DEXTROAMPHETAMINE 5 MG PO TABS
10.0000 mg | ORAL_TABLET | Freq: Three times a day (TID) | ORAL | 0 refills | Status: DC | PRN
  Filled 2022-03-26: qty 180, 30d supply, fill #0

## 2022-03-26 NOTE — Telephone Encounter (Signed)
Patient's first ocrevus infusion was 03/20/2022. ?

## 2022-04-15 IMAGING — MG MM DIGITAL DIAGNOSTIC UNILAT*R* W/ TOMO W/ CAD
4 series · 4 of 12 positions shown · non-contrast
Comparison: Previous exam(s).

CLINICAL DATA: Short-term interval follow-up of a likely benign
asymmetry in the central RIGHT breast that was previously visible
only on the CC view and short-term interval follow-up of a likely
benign mass in the UPPER INNER QUADRANT at the 1 o'clock position 6
cm from the nipple.

EXAM:
DIGITAL DIAGNOSTIC UNILATERAL RIGHT MAMMOGRAM WITH TOMOSYNTHESIS AND
CAD; ULTRASOUND RIGHT BREAST LIMITED
TECHNIQUE: Right digital diagnostic mammography and breast tomosynthesis was
performed. The images were evaluated with computer-aided detection.;
Targeted ultrasound examination of the right breast was performed.

[R CC synth-2D]
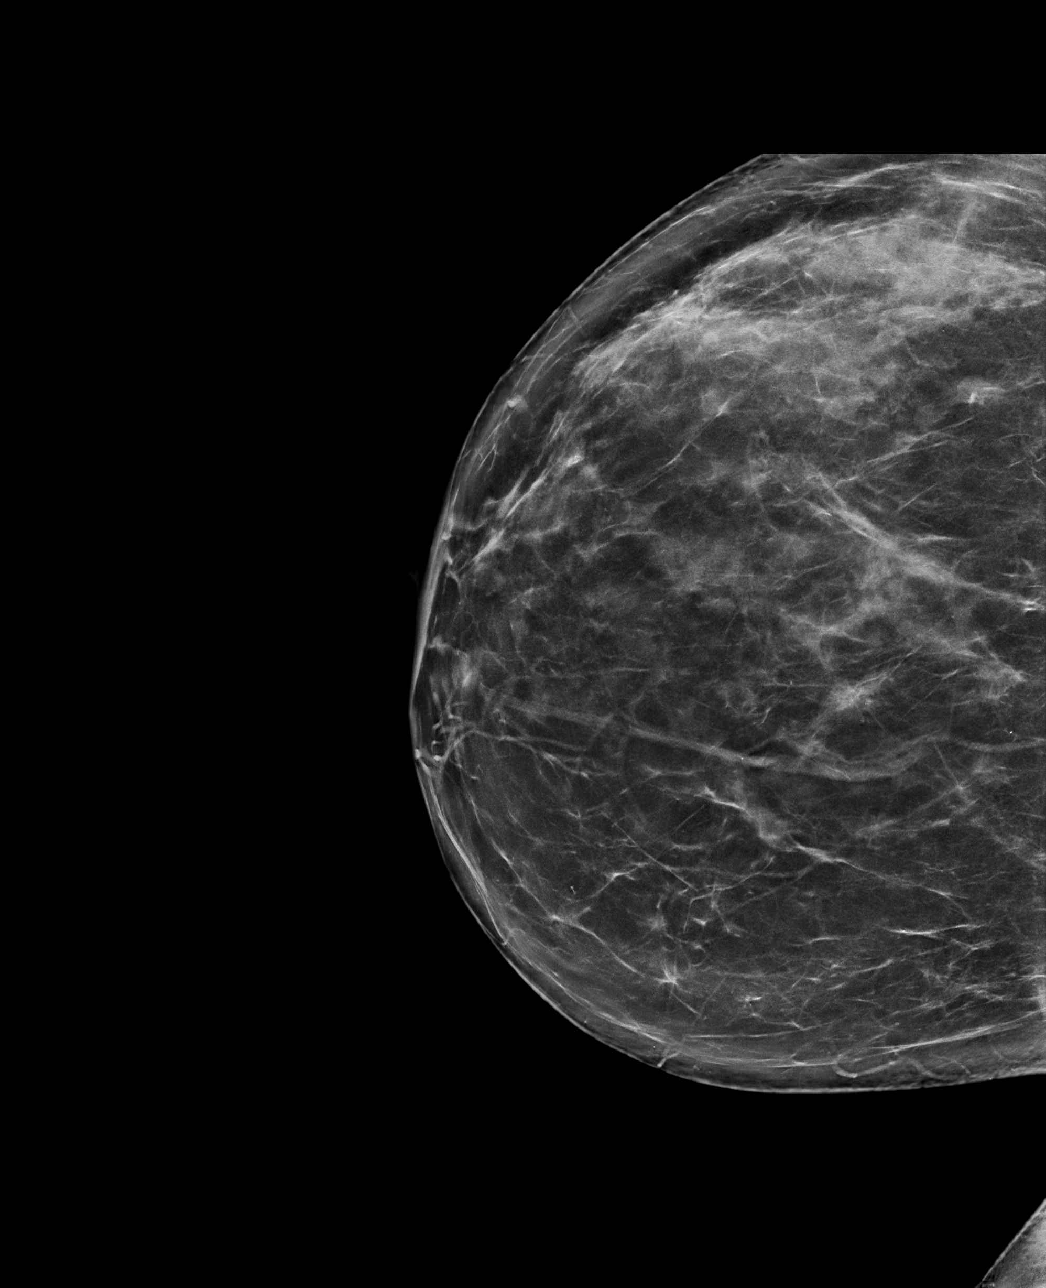

[R MLO synth-2D]
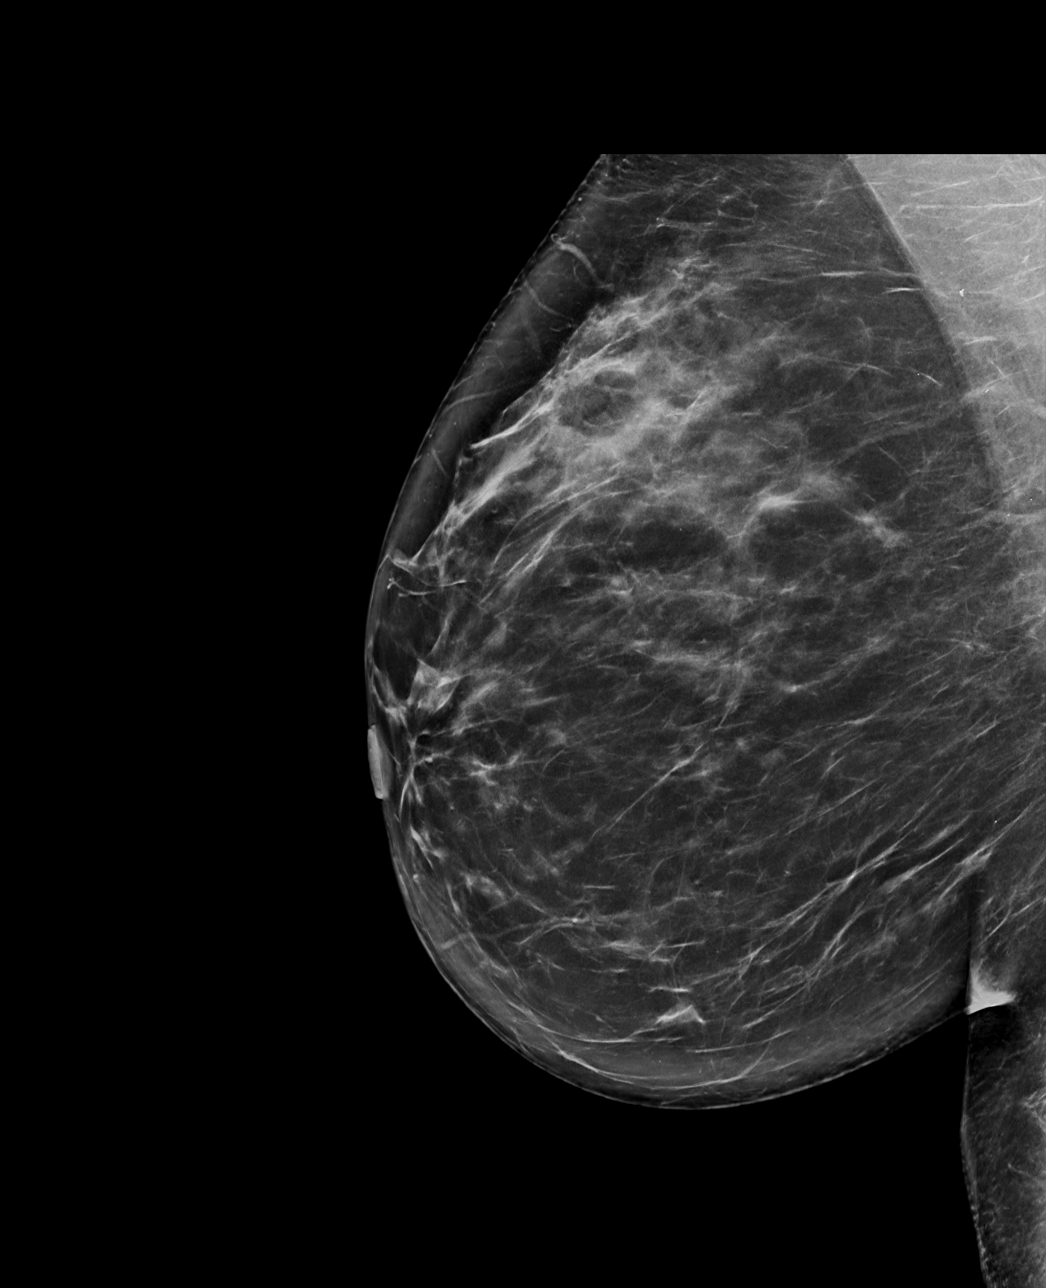

[R CC tomo · tomo slice 41/82.0]
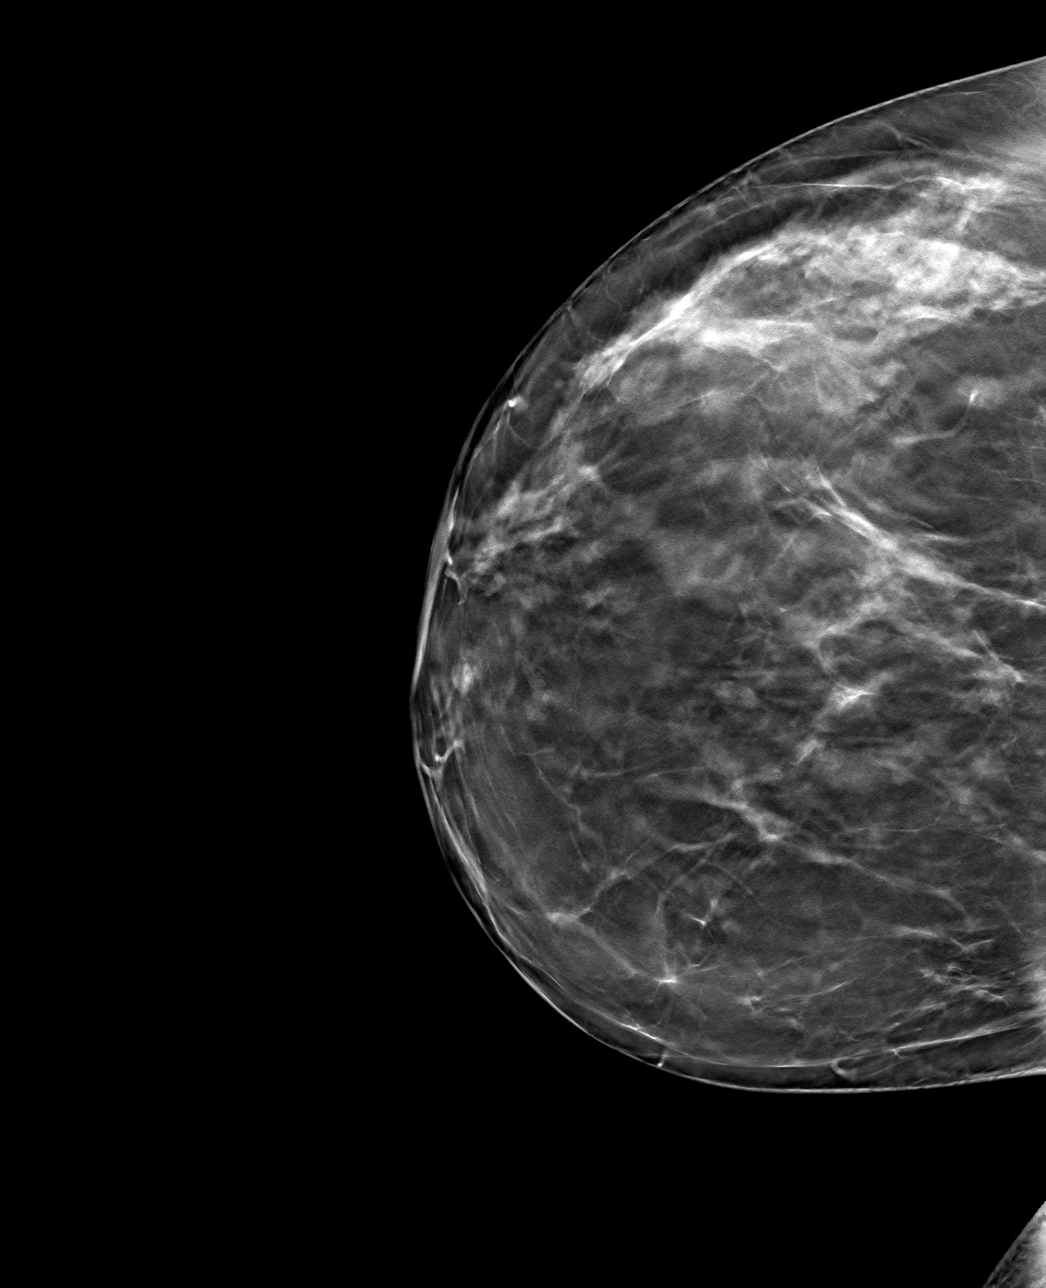

[R MLO tomo · tomo slice 47/93.0]
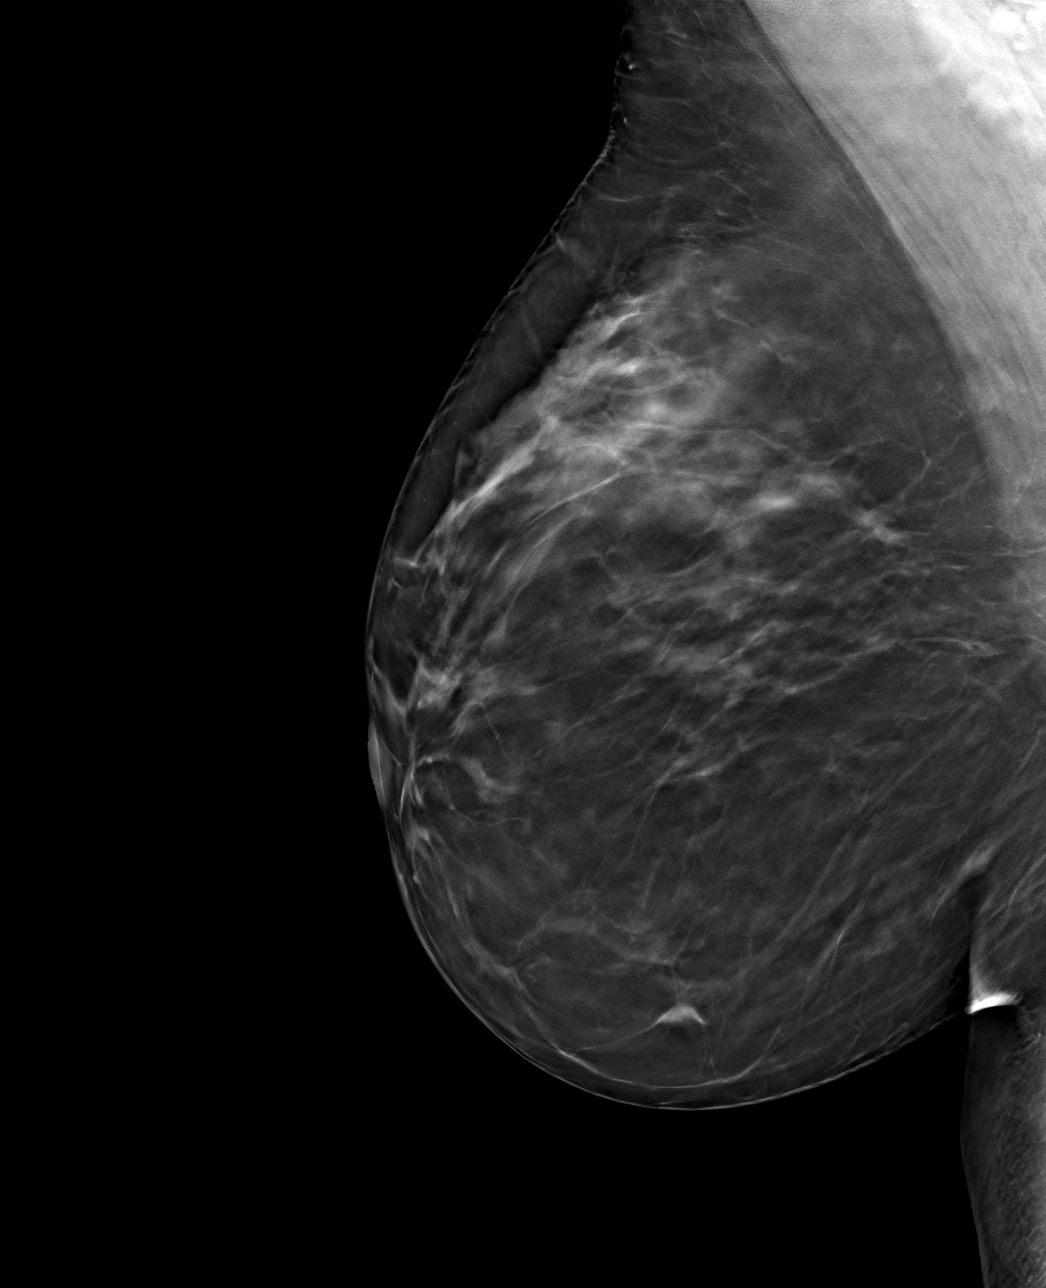

[4 of 12 positions shown; findings below may reference images not displayed]

ACR Breast Density Category c: The breast tissue is heterogeneously
dense, which may obscure small masses.
FINDINGS: Full field CC and MLO views were obtained.

The asymmetry in the central breast questioned on the prior
mammogram in January 2021 is less conspicuous on the current
examination, indicating that it likely represents normal
fibroglandular tissue. The mass in the UPPER INNER QUADRANT is
inconspicuous on mammography. No new or suspicious findings
elsewhere.

Targeted ultrasound is performed, demonstrating that the previously
identified circumscribed nearly round hypoechoic mass at the 1
o'clock position 6 cm from nipple is unchanged, measuring 4 x 4 x 4
mm (previously 4 x 4 x 3 mm on 03/10/2021), demonstrating posterior
acoustic enhancement and no internal power Doppler flow.
IMPRESSION: 1. Stable likely benign 4 mm mass in the UPPER OUTER QUADRANT of the
RIGHT breast at the 1 o'clock position 6 cm from nipple, possibly a
complicated cyst.
2. The asymmetry in the central breast is less conspicuous
currently, indicating that it likely represents normal
fibroglandular tissue.

RECOMMENDATION:
RIGHT breast ultrasound at the time of annual BILATERAL diagnostic
mammography in 6 months.

I have discussed the findings and recommendations with the patient.
If applicable, a reminder letter will be sent to the patient
regarding the next appointment.

BI-RADS CATEGORY  3: Probably benign.

## 2022-04-15 IMAGING — US US BREAST*R* LIMITED INC AXILLA
1 series · 5 of 5 positions shown · non-contrast
Comparison: Previous exam(s).

CLINICAL DATA: Short-term interval follow-up of a likely benign
asymmetry in the central RIGHT breast that was previously visible
only on the CC view and short-term interval follow-up of a likely
benign mass in the UPPER INNER QUADRANT at the 1 o'clock position 6
cm from the nipple.

EXAM:
DIGITAL DIAGNOSTIC UNILATERAL RIGHT MAMMOGRAM WITH TOMOSYNTHESIS AND
CAD; ULTRASOUND RIGHT BREAST LIMITED
TECHNIQUE: Right digital diagnostic mammography and breast tomosynthesis was
performed. The images were evaluated with computer-aided detection.;
Targeted ultrasound examination of the right breast was performed.

[Series 1: us breast*right* limited inc axilla · 0.06mm/px · 5 of 5 slices shown]
[im 1/5]
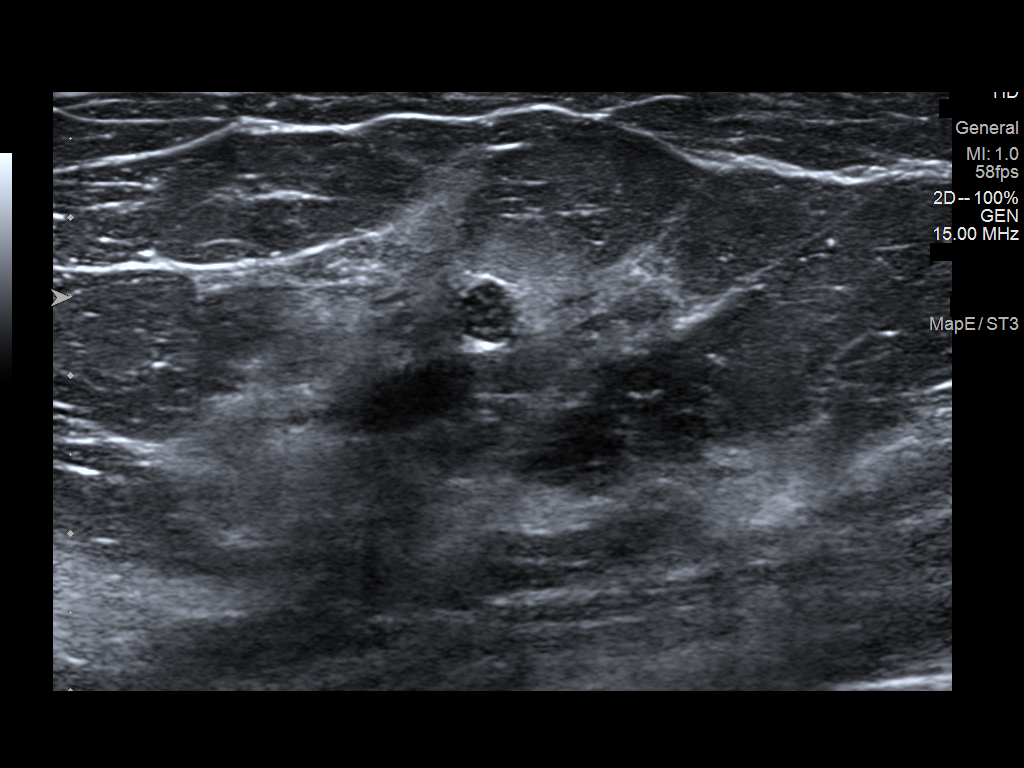
[im 2/5]
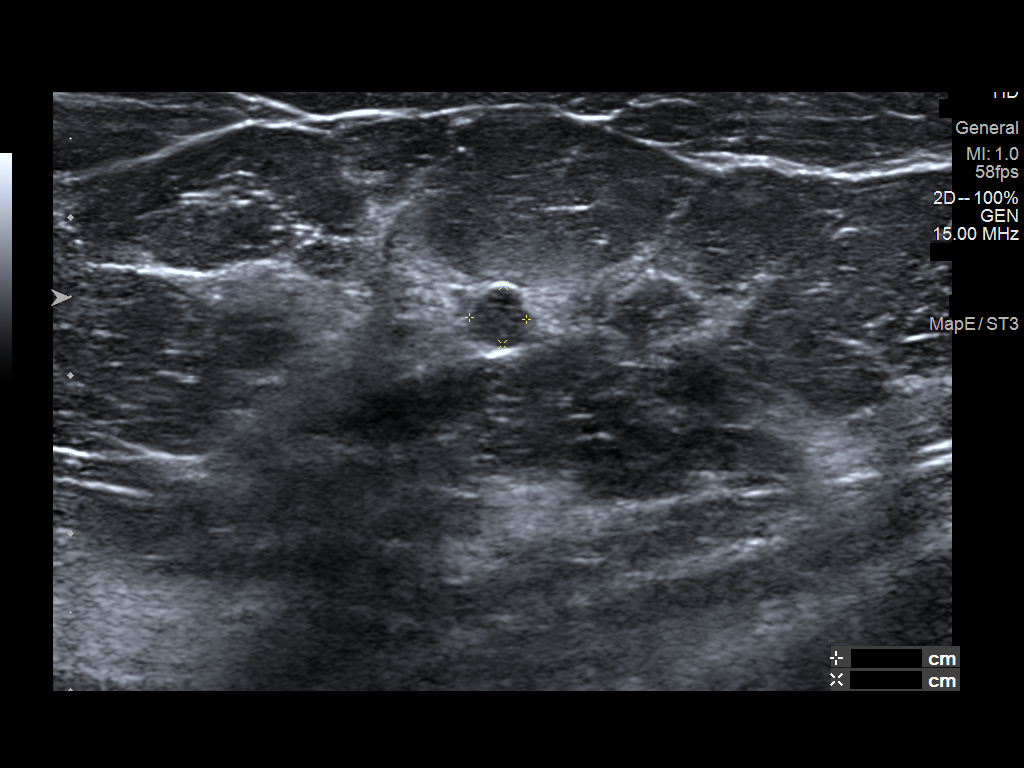
[im 3/5]
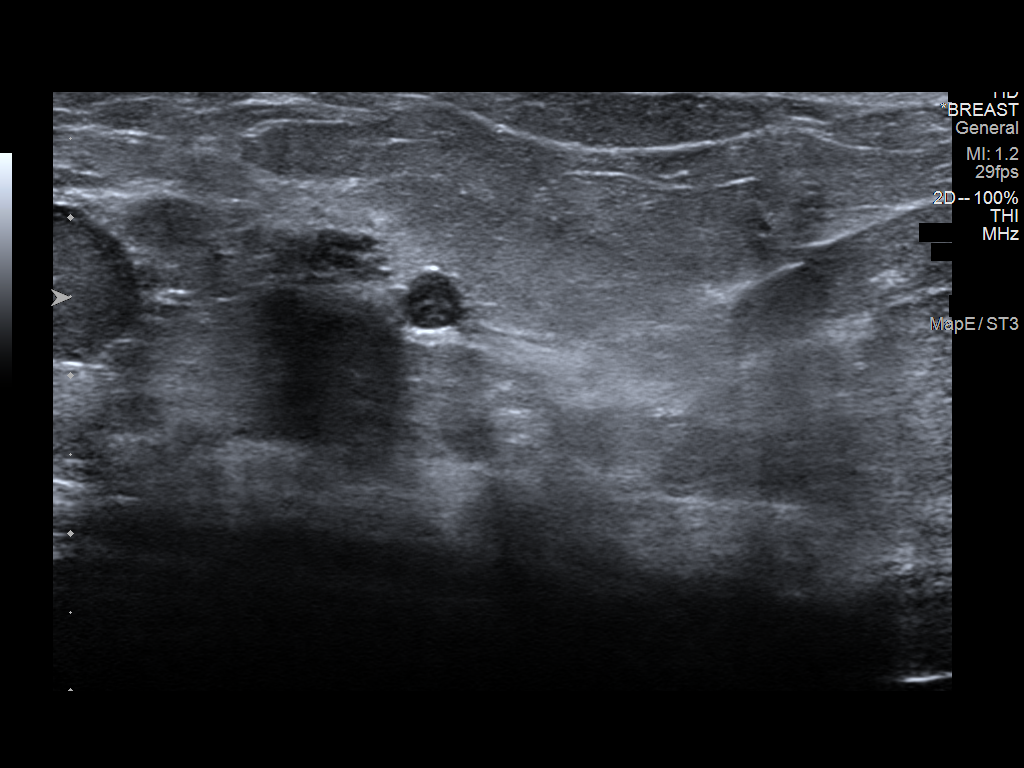
[im 4/5]
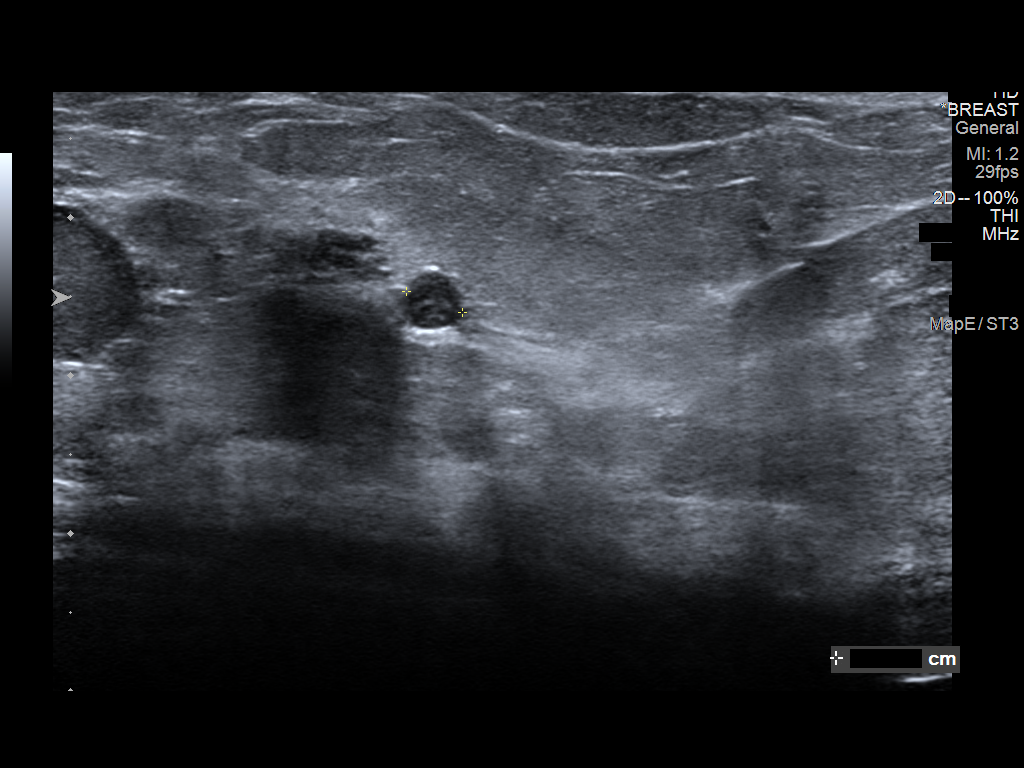
[im 5/5]
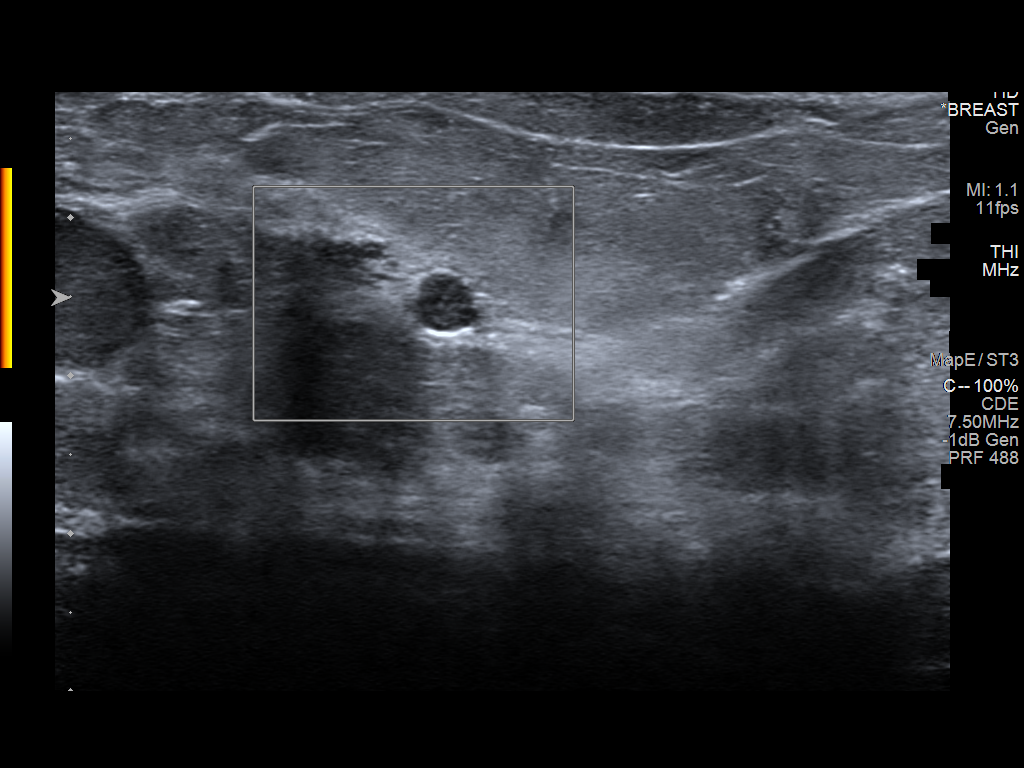

[5 of 5 positions shown; findings below may reference images not displayed]

ACR Breast Density Category c: The breast tissue is heterogeneously
dense, which may obscure small masses.
FINDINGS: Full field CC and MLO views were obtained.

The asymmetry in the central breast questioned on the prior
mammogram in January 2021 is less conspicuous on the current
examination, indicating that it likely represents normal
fibroglandular tissue. The mass in the UPPER INNER QUADRANT is
inconspicuous on mammography. No new or suspicious findings
elsewhere.

Targeted ultrasound is performed, demonstrating that the previously
identified circumscribed nearly round hypoechoic mass at the 1
o'clock position 6 cm from nipple is unchanged, measuring 4 x 4 x 4
mm (previously 4 x 4 x 3 mm on 03/10/2021), demonstrating posterior
acoustic enhancement and no internal power Doppler flow.
IMPRESSION: 1. Stable likely benign 4 mm mass in the UPPER OUTER QUADRANT of the
RIGHT breast at the 1 o'clock position 6 cm from nipple, possibly a
complicated cyst.
2. The asymmetry in the central breast is less conspicuous
currently, indicating that it likely represents normal
fibroglandular tissue.

RECOMMENDATION:
RIGHT breast ultrasound at the time of annual BILATERAL diagnostic
mammography in 6 months.

I have discussed the findings and recommendations with the patient.
If applicable, a reminder letter will be sent to the patient
regarding the next appointment.

BI-RADS CATEGORY  3: Probably benign.

## 2022-04-23 ENCOUNTER — Other Ambulatory Visit: Payer: BC Managed Care – PPO

## 2022-05-08 ENCOUNTER — Ambulatory Visit
Admission: RE | Admit: 2022-05-08 | Discharge: 2022-05-08 | Disposition: A | Discharge status: Home / Self Care | Payer: BC Managed Care – PPO | Source: Ambulatory Visit | Attending: Medical | Admitting: Medical

## 2022-05-08 ENCOUNTER — Other Ambulatory Visit: Payer: Self-pay | Admitting: Medical

## 2022-05-08 DIAGNOSIS — N6310 Unspecified lump in the right breast, unspecified quadrant: Secondary | ICD-10-CM

## 2022-05-11 ENCOUNTER — Encounter: Payer: Self-pay | Admitting: Neurology

## 2022-05-14 ENCOUNTER — Other Ambulatory Visit: Payer: Self-pay | Admitting: Neurology

## 2022-05-15 ENCOUNTER — Other Ambulatory Visit (HOSPITAL_BASED_OUTPATIENT_CLINIC_OR_DEPARTMENT_OTHER): Payer: Self-pay

## 2022-05-15 ENCOUNTER — Other Ambulatory Visit: Payer: Self-pay | Admitting: *Deleted

## 2022-05-15 MED ORDER — AMPHETAMINE-DEXTROAMPHETAMINE 10 MG PO TABS
ORAL_TABLET | Freq: Three times a day (TID) | ORAL | 0 refills | Status: DC | PRN
  Filled 2022-05-15: qty 90, 30d supply, fill #0

## 2022-06-29 ENCOUNTER — Encounter: Payer: Self-pay | Admitting: Neurology

## 2022-07-02 ENCOUNTER — Other Ambulatory Visit: Payer: Self-pay | Admitting: Neurology

## 2022-07-02 ENCOUNTER — Other Ambulatory Visit (HOSPITAL_BASED_OUTPATIENT_CLINIC_OR_DEPARTMENT_OTHER): Payer: Self-pay

## 2022-07-02 MED ORDER — AMPHETAMINE-DEXTROAMPHETAMINE 10 MG PO TABS
ORAL_TABLET | Freq: Three times a day (TID) | ORAL | 0 refills | Status: DC | PRN
  Filled 2022-07-02: qty 90, 30d supply, fill #0

## 2022-08-16 ENCOUNTER — Other Ambulatory Visit (HOSPITAL_BASED_OUTPATIENT_CLINIC_OR_DEPARTMENT_OTHER): Payer: Self-pay

## 2022-08-16 ENCOUNTER — Encounter: Payer: Self-pay | Admitting: Neurology

## 2022-08-16 ENCOUNTER — Other Ambulatory Visit: Payer: Self-pay | Admitting: *Deleted

## 2022-08-16 MED ORDER — AMPHETAMINE-DEXTROAMPHETAMINE 10 MG PO TABS
ORAL_TABLET | Freq: Three times a day (TID) | ORAL | 0 refills | Status: DC | PRN
  Filled 2022-08-16: qty 90, 30d supply, fill #0

## 2022-08-21 ENCOUNTER — Ambulatory Visit (INDEPENDENT_AMBULATORY_CARE_PROVIDER_SITE_OTHER): Payer: BC Managed Care – PPO | Admitting: Neurology

## 2022-08-21 ENCOUNTER — Encounter: Payer: Self-pay | Admitting: Neurology

## 2022-08-21 VITALS — BP 148/90 | HR 97 | Ht 64.0 in | Wt 169.5 lb

## 2022-08-21 DIAGNOSIS — M5431 Sciatica, right side: Secondary | ICD-10-CM

## 2022-08-21 DIAGNOSIS — Z79899 Other long term (current) drug therapy: Secondary | ICD-10-CM

## 2022-08-21 DIAGNOSIS — G35 Multiple sclerosis: Secondary | ICD-10-CM | POA: Diagnosis not present

## 2022-08-21 DIAGNOSIS — F419 Anxiety disorder, unspecified: Secondary | ICD-10-CM

## 2022-08-21 DIAGNOSIS — R5383 Other fatigue: Secondary | ICD-10-CM

## 2022-08-21 NOTE — Progress Notes (Signed)
[facility]  PATIENT: Laurie Cole DOB: Aug 07, 1976  REFERRING CLINICIAN: PCP is Penni Homans   REASON FOR VISIT: MS   HISTORICAL  CHIEF COMPLAINT:  Chief Complaint  Patient presents with   Follow-up    Pt in rm #2 and alone. Pt states she has question about covid vaccine. Pt states she has pain in her lower back and buttock from clean over the weekend.    HISTORY OF PRESENT ILLNESS:  Laurie Cole is a 46 y.o. woman with relapsing remitting MS diagnosed in 2006.     Update 08/21/2022 She switched to [place] and first dose was Aprl 2023 and next is October   she had been on Vumerity but had low lymphocyte counts.  Even on one half dose lymphocyte counts were low normal prompting the change in therapy   She had done well in the past on Tysabri but unfortunately became JCV antibody positive  She denies any new physical neurologic symptoms or recent exacerbation.   MRIs have been stable (01/28/2022).    Brain volume is good.   The MRIs show predominantly periventricular foci with just a couple juxtacortical foci.   No brainstem or cerebellar lesions.  In the past MRI has shown a cervical spine lesion at C3  Gait, strength, sensation are doing well.   Fatigue and Attention deficit have done well on Adderall.  Mood is doing well on fluoxetine.    She is sleeping well with a very regular schedule  She hurt her back last week and has continued buttock pain on the right radiating to the posterior thigh.  The back feels stiff.   She has a positive ANA (nuclear/homogenous 1:80 and nuclear/nucleolar 1:320).       No specific rheumatologic disorder has been diagnosed.  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 MRIs were performed that were consistent  with multiple sclerosis. Additionally, visual evoked potentials showed slowing of the P100. She was placed on Betaseron. She stopped when she was pregnant about 2 years later. She returned to Betaseron for about another year and one half after her son was born. Then she had some breakthrough relapses and she was switched to Tysabri and tolerated it very well. Unfortunately, after a couple years she converted from negative to positive at a high titer.   She did not have any exacerbations while on Tysabri. She then switched to Tecfidera around 2014.  Initially she had some GI issues as she had persistent flushing.  Therefore, in 2019 she switched to Vumerity and the flushing improved.  However, due to low lymphocytes the dose had to be reduced in 2020  Imaging: MRI of the brain 01/28/2022 shows  Multiple T2/FLAIR hyperintense foci in the periventricular, juxtacortical and deep white matter of the hemispheres.  Foci are also noted in the right cerebellar hemisphere adjacent to the fourth ventricle and to the right within the pons.  The pattern is consistent with chronic demyelinating plaque associated with multiple sclerosis.  None of the foci enhanced or appear to be acute.  Compared to the MRI from 01/10/2021, there were no new lesions.  MRI of the brain 11/24/2018 shows T2/flair hyperintense foci in the hemispheres, right cerebellar hemisphere and possibly the right pons in a pattern and configuration consistent  with chronic demyelinating plaque associated with multiple sclerosis.  None of the foci appears to be acute and they do not enhance.  When compared to the MRI dated 01/25/2018, there is no interval change  MRI of the cervical spine 12/02/2004 showed a posterior T2 hyperintense focus adjacent to C3.  REVIEW OF SYSTEMS:  Constitutional: No fevers, chills, sweats, or change in appetite.  Notes  Fatigue Eyes: No visual changes, double vision, eye pain Ear, nose and throat: No hearing loss, ear pain,  nasal congestion, sore throat Cardiovascular: No chest pain, palpitations Respiratory:  No shortness of breath at rest or with exertion.   No wheezes GastrointestinaI: No nausea, vomiting, diarrhea, abdominal pain, fecal incontinence Genitourinary:  No dysuria, urinary retention or frequency.  No nocturia. Musculoskeletal:  No neck pain, back pain.   Reports a muscle spasm sensation in the right hip. Integumentary: No rash, pruritus, skin lesions Neurological: as above Psychiatric: Notes  depression at this time.  No anxiety Endocrine: No palpitations, diaphoresis, change in appetite, change in weigh or increased thirst Hematologic/Lymphatic:  No anemia, purpura, petechiae. Allergic/Immunologic: No itchy/runny eyes, nasal congestion, recent allergic reactions, rashes  ALLERGIES: No Known Allergies  HOME MEDICATIONS:  Current Outpatient Medications:    amphetamine-dextroamphetamine (ADDERALL) 10 MG tablet, TAKE 1 TABLET BY MOUTH 3 TIMES DAILY AS NEEDED., Disp: 90 tablet, Rfl: 0   amphetamine-dextroamphetamine (ADDERALL) 5 MG tablet, Take 2 tablets (10 mg total) by mouth 3 (three) times daily as needed., Disp: 180 tablet, Rfl: 0   cholecalciferol (VITAMIN D) 1000 units tablet, Take 5,000 Units by mouth daily., Disp: , Rfl:    FLUoxetine (PROZAC) 20 MG capsule, Take 1 capsule (20 mg total) by mouth daily., Disp: 90 capsule, Rfl: 3   triamterene-hydrochlorothiazide (DYAZIDE) 37.5-25 MG capsule, Take 1 each (1 capsule total) by mouth daily., Disp: 90 capsule, Rfl: 3   VUMERITY 231 MG CPDR, Take 2 capsules by mouth 2 (two) times daily., Disp: 360 capsule, Rfl: 1   PAST MEDICAL HISTORY: Past Medical History:  Diagnosis Date   Depression    MS (multiple sclerosis) ([place])    sees Dr Kerman Passey in [facility]    PAST SURGICAL HISTORY: Past Surgical History:  Procedure Laterality Date   LIPOSUCTION      FAMILY HISTORY: Family History  Problem Relation Age of Onset   Drug abuse Mother        opiod  addiction   Arthritis Mother        back pain   Arthritis Father    Arthritis/Rheumatoid Father    Ankylosing spondylitis Father    Seizures Maternal Aunt    Parkinsonism Maternal Grandfather    Diabetes Maternal Grandfather    Lupus Maternal Aunt    Mental illness Brother        bipolar disorder   Parkinson's disease Paternal Grandmother    Arthritis Sister    Mental illness Sister    Drug abuse Sister    Breast cancer Paternal Aunt     SOCIAL HISTORY:  Social History   Socioeconomic History   Marital status: Married    Spouse name: Jeneen Rinks   Number of children: 1   Years of education: 18   Highest education level: Not on file  Occupational History   Occupation: Education officer, museum  Tobacco Use   Smoking status: Never   Smokeless tobacco: Never  Substance and Sexual Activity   Alcohol use: Yes    Alcohol/week: 1.0 standard drink of alcohol    Types: 1 Standard  drinks or equivalent per week   Drug use: No   Sexual activity: Yes    Partners: Male    Birth control/protection: Surgical  Other Topics Concern   Not on file  Social History Narrative   Lives with husband and son, works as Education officer, museum, no dietary restrictions, exercises intermittently. Wears a seat belt regularly   Social Determinants of Health   Financial Resource Strain: Not on file  Food Insecurity: Not on file  Transportation Needs: Not on file  Physical Activity: Not on file  Stress: Not on file  Social Connections: Not on file  Intimate Partner Violence: Not on file     PHYSICAL EXAM  Vitals:   08/21/22 1505  BP: (!) 148/90  Pulse: 97  Weight: 169 lb 8 oz (76.9 kg)  Height: '5\' 4"'$  (1.626 m)    Body mass index is 29.09 kg/m.   General: The patient is well-developed and well-nourished and in no acute distress.  There is 2+ pedal edema.  Neck: There is some tenderness over the right piriformis muscle as well as the lower right lumbar paraspinal muscles.  Neurologic Exam  Mental  status: The patient is alert and oriented x 3 at the time of the examination. The patient has apparent normal recent and remote memory, with an apparently normal attention span and concentration ability.   Speech is normal.  Cranial nerves: Extraocular movements are full.  Facial strength is normal.   No dysarthria.   No obvious hearing deficits are noted.  Motor:  There are no fasciculations.  Muscle bulk, tone and strength are normal.  Sensory: She has ntact sensation to touch and vibration in the arms and legs.  Coordination: Cerebellar testing reveals good finger-nose-finger and heel-to-shin bilaterally.  Gait and station: Station is normal.  Gait and tandem gait are normal.   Romberg is negative.  Reflexes: Deep tendon reflexes are mildly increased in the legs.. No ankle clonus.  _____________________________________________  Multiple sclerosis ([place]) - Plan: CBC with Differential/Platelet, IgG, IgA, IgM  High risk medication use - Plan: CBC with Differential/Platelet, IgG, IgA, IgM  Anxiety  Other fatigue  Sciatica of right side  1.   She will continue Ocrevus.  We will check some lab work. 2.   Stay active and exercise as tolerated.    3.    Continue Adderall.  Continue fluoxetine 4.    Trigger point injection 80 mg Depo-Medrol and 4 cc Marcaine into the right piriformis muscle and the L4 and L5 paraspinal muscles on the right.  Using sterile technique.  She tolerated the procedure well and there were no complications.   5.   Return in 6 months or sooner if there are new or worsening neurologic symptoms.   Saphyra Hutt A. Felecia Shelling, MD, PhD 9/62/9528, 4:13 PM Certified in Neurology, Clinical Neurophysiology, Sleep Medicine, Pain Medicine and Neuroimaging  [facility] [address] [place], [place] [address] [phone]

## 2022-08-22 LAB — CBC WITH DIFFERENTIAL/PLATELET
Basophils Absolute: 0.1 10*3/uL (ref 0.0–0.2)
Basos: 1 %
EOS (ABSOLUTE): 0.1 10*3/uL (ref 0.0–0.4)
Eos: 1 %
Hematocrit: 37.3 % (ref 34.0–46.6)
Hemoglobin: 13.2 g/dL (ref 11.1–15.9)
Immature Grans (Abs): 0 10*3/uL (ref 0.0–0.1)
Immature Granulocytes: 0 %
Lymphocytes Absolute: 0.9 10*3/uL (ref 0.7–3.1)
Lymphs: 11 %
MCH: 35.3 pg — ABNORMAL HIGH (ref 26.6–33.0)
MCHC: 35.4 g/dL (ref 31.5–35.7)
MCV: 100 fL — ABNORMAL HIGH (ref 79–97)
Monocytes Absolute: 0.5 10*3/uL (ref 0.1–0.9)
Monocytes: 6 %
Neutrophils Absolute: 6.7 10*3/uL (ref 1.4–7.0)
Neutrophils: 81 %
Platelets: 295 10*3/uL (ref 150–450)
RBC: 3.74 x10E6/uL — ABNORMAL LOW (ref 3.77–5.28)
RDW: 13.1 % (ref 11.7–15.4)
WBC: 8.3 10*3/uL (ref 3.4–10.8)

## 2022-08-22 LAB — IGG, IGA, IGM
IgA/Immunoglobulin A, Serum: 200 mg/dL (ref 87–352)
IgG (Immunoglobin G), Serum: 1051 mg/dL (ref 586–1602)
IgM (Immunoglobulin M), Srm: 157 mg/dL (ref 26–217)

## 2022-10-11 ENCOUNTER — Other Ambulatory Visit (HOSPITAL_BASED_OUTPATIENT_CLINIC_OR_DEPARTMENT_OTHER): Payer: Self-pay

## 2022-10-11 ENCOUNTER — Other Ambulatory Visit: Payer: Self-pay | Admitting: *Deleted

## 2022-10-11 ENCOUNTER — Encounter: Payer: Self-pay | Admitting: Neurology

## 2022-10-11 MED ORDER — AMPHETAMINE-DEXTROAMPHETAMINE 10 MG PO TABS
ORAL_TABLET | Freq: Three times a day (TID) | ORAL | 0 refills | Status: DC | PRN
  Filled 2022-10-11: qty 90, 30d supply, fill #0

## 2022-11-08 ENCOUNTER — Other Ambulatory Visit: Payer: BC Managed Care – PPO

## 2022-11-29 ENCOUNTER — Other Ambulatory Visit: Payer: Self-pay | Admitting: Neurology

## 2022-11-29 ENCOUNTER — Encounter: Payer: Self-pay | Admitting: Neurology

## 2022-11-29 ENCOUNTER — Telehealth: Payer: Self-pay

## 2022-11-29 ENCOUNTER — Other Ambulatory Visit (HOSPITAL_BASED_OUTPATIENT_CLINIC_OR_DEPARTMENT_OTHER): Payer: Self-pay

## 2022-11-29 MED ORDER — AMPHETAMINE-DEXTROAMPHETAMINE 10 MG PO TABS
10.0000 mg | ORAL_TABLET | Freq: Three times a day (TID) | ORAL | 0 refills | Status: DC | PRN
  Filled 2022-11-29: qty 30, 10d supply, fill #0

## 2022-11-29 NOTE — Telephone Encounter (Signed)
RX note sent to work in MD

## 2022-11-29 NOTE — Telephone Encounter (Signed)
 Drug registry Verified-Dextroamp-Amphetamin 10 Mg Tab Last refill-10/11/2022 Qty: 90 for 30 days  Last OV:08/21/22 Pending appointment:  02/21/23

## 2022-12-04 ENCOUNTER — Other Ambulatory Visit (HOSPITAL_BASED_OUTPATIENT_CLINIC_OR_DEPARTMENT_OTHER): Payer: Self-pay

## 2022-12-05 ENCOUNTER — Other Ambulatory Visit: Payer: Self-pay | Admitting: *Deleted

## 2022-12-05 ENCOUNTER — Other Ambulatory Visit (HOSPITAL_BASED_OUTPATIENT_CLINIC_OR_DEPARTMENT_OTHER): Payer: Self-pay

## 2022-12-05 MED ORDER — AMPHETAMINE-DEXTROAMPHETAMINE 10 MG PO TABS
10.0000 mg | ORAL_TABLET | Freq: Three times a day (TID) | ORAL | 0 refills | Status: DC | PRN
  Filled 2022-12-05: qty 60, 20d supply, fill #0

## 2022-12-07 ENCOUNTER — Ambulatory Visit
Admission: RE | Admit: 2022-12-07 | Discharge: 2022-12-07 | Disposition: A | Discharge status: Home / Self Care | Payer: BC Managed Care – PPO | Source: Ambulatory Visit | Attending: Medical | Admitting: Medical

## 2022-12-07 DIAGNOSIS — N6310 Unspecified lump in the right breast, unspecified quadrant: Secondary | ICD-10-CM

## 2022-12-18 ENCOUNTER — Other Ambulatory Visit: Payer: Self-pay | Admitting: Medical

## 2022-12-18 DIAGNOSIS — N6310 Unspecified lump in the right breast, unspecified quadrant: Secondary | ICD-10-CM

## 2022-12-19 ENCOUNTER — Other Ambulatory Visit: Payer: Self-pay | Admitting: Medical

## 2022-12-19 DIAGNOSIS — N6310 Unspecified lump in the right breast, unspecified quadrant: Secondary | ICD-10-CM

## 2022-12-20 ENCOUNTER — Other Ambulatory Visit (HOSPITAL_BASED_OUTPATIENT_CLINIC_OR_DEPARTMENT_OTHER): Payer: Self-pay

## 2022-12-20 ENCOUNTER — Other Ambulatory Visit: Payer: Self-pay | Admitting: *Deleted

## 2022-12-20 MED ORDER — AMPHETAMINE-DEXTROAMPHETAMINE 10 MG PO TABS
10.0000 mg | ORAL_TABLET | Freq: Three times a day (TID) | ORAL | 0 refills | Status: DC | PRN
  Filled 2022-12-20: qty 90, 30d supply, fill #0

## 2022-12-20 NOTE — Telephone Encounter (Signed)
Per drug registry, last refilled 11/29/22 #30. Last seen 08/21/22 and next f/u 02/21/23.

## 2023-01-02 ENCOUNTER — Other Ambulatory Visit (HOSPITAL_COMMUNITY): Payer: Self-pay

## 2023-02-14 ENCOUNTER — Other Ambulatory Visit: Payer: Self-pay | Admitting: *Deleted

## 2023-02-14 ENCOUNTER — Other Ambulatory Visit (HOSPITAL_BASED_OUTPATIENT_CLINIC_OR_DEPARTMENT_OTHER): Payer: Self-pay

## 2023-02-14 ENCOUNTER — Encounter: Payer: Self-pay | Admitting: Neurology

## 2023-02-14 ENCOUNTER — Other Ambulatory Visit: Payer: Self-pay | Admitting: Neurology

## 2023-02-14 DIAGNOSIS — G35 Multiple sclerosis: Secondary | ICD-10-CM

## 2023-02-14 MED ORDER — AMPHETAMINE-DEXTROAMPHETAMINE 10 MG PO TABS
10.0000 mg | ORAL_TABLET | Freq: Three times a day (TID) | ORAL | 0 refills | Status: DC | PRN
  Filled 2023-02-14: qty 90, 30d supply, fill #0

## 2023-02-14 NOTE — Telephone Encounter (Signed)
Last seen 08/21/22 and next f/u 02/21/23. Per drug registry, last refilled 12/20/22 #90.

## 2023-02-14 NOTE — Telephone Encounter (Signed)
Sent separate refill request for MD to e-scribe adderall.

## 2023-02-19 ENCOUNTER — Other Ambulatory Visit (HOSPITAL_COMMUNITY): Payer: Self-pay

## 2023-02-21 ENCOUNTER — Ambulatory Visit (INDEPENDENT_AMBULATORY_CARE_PROVIDER_SITE_OTHER): Payer: BC Managed Care – PPO | Admitting: Neurology

## 2023-02-21 ENCOUNTER — Encounter: Payer: Self-pay | Admitting: Neurology

## 2023-02-21 VITALS — BP 134/89 | HR 96 | Ht 63.0 in | Wt 176.5 lb

## 2023-02-21 DIAGNOSIS — G35 Multiple sclerosis: Secondary | ICD-10-CM

## 2023-02-21 DIAGNOSIS — R5383 Other fatigue: Secondary | ICD-10-CM | POA: Diagnosis not present

## 2023-02-21 DIAGNOSIS — Z79899 Other long term (current) drug therapy: Secondary | ICD-10-CM | POA: Diagnosis not present

## 2023-02-21 DIAGNOSIS — F419 Anxiety disorder, unspecified: Secondary | ICD-10-CM | POA: Diagnosis not present

## 2023-02-21 DIAGNOSIS — R600 Localized edema: Secondary | ICD-10-CM

## 2023-02-21 NOTE — Progress Notes (Signed)
[facility]  PATIENT: Laurie Cole DOB: 11-29-1976  REFERRING CLINICIAN: PCP is Penni Homans   REASON FOR VISIT: MS   HISTORICAL  CHIEF COMPLAINT:  Chief Complaint  Patient presents with   Room 10    Pt is here Alone. Pt states that things have been going great since last appointment. Pt states no new symptoms or changes to report.     HISTORY OF PRESENT ILLNESS:  Laurie Cole is a 47 y.o. woman with relapsing remitting MS diagnosed in 2006.     Update 02/21/2023 She has been on Ocrevus and first dose was Aprl 2023 and next is October   she had been on Vumerity but had low lymphocyte counts.  Even on one half dose lymphocyte counts were low normal prompting the change in therapy   She had done well in the past on Tysabri but unfortunately became JCV antibody positive  She denies any new physical neurologic symptoms or recent exacerbation.   MRIs have been stable (01/28/2022). Next MRi next month.     Brain volume is good.   The MRIs show predominantly periventricular foci with just a couple juxtacortical foci.   No brainstem or cerebellar lesions.  In the past MRI has shown a cervical spine lesion at C3  She reports that her gait, strength, sensation are doing well.   Her fatigue and Attention deficit have done well on Adderall.  Mood is doing well on fluoxetine.    She is sleeping well with a very regular schedule  She takes a diuretic due to ankle edema.  She is stopping OCP to see if improves.    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 MRIs were performed that were consistent with multiple sclerosis. Additionally, visual evoked potentials showed slowing of the P100. She was placed on Betaseron. She stopped when she was pregnant  about 2 years later. She returned to Betaseron for about another year and one half after her son was born. Then she had some breakthrough relapses and she was switched to Tysabri and tolerated it very well. Unfortunately, after a couple years she converted from negative to positive at a high titer.   She did not have any exacerbations while on Tysabri. She then switched to Tecfidera around 2014.  Initially she had some GI issues as she had persistent flushing.  Therefore, in 2019 she switched to Vumerity and the flushing improved.  However, due to low lymphocytes the dose had to be reduced in 2020  Switched to [facility] 03/2022.  Imaging: MRI of the brain 01/28/2022 shows  Multiple T2/FLAIR hyperintense foci in the periventricular, juxtacortical and deep white matter of the hemispheres.  Foci are also noted in the right cerebellar hemisphere adjacent to the fourth ventricle and to the right within the pons.  The pattern is consistent with chronic demyelinating plaque associated with multiple sclerosis.  None of the foci enhanced or appear to be acute.  Compared to the MRI from 01/10/2021, there were no new lesions.  MRI of the brain 11/24/2018 shows T2/flair hyperintense foci in the hemispheres, right cerebellar hemisphere and possibly the right pons in a pattern and configuration consistent with chronic demyelinating plaque associated with multiple sclerosis.  None of the foci appears to be acute and  they do not enhance.  When compared to the MRI dated 01/25/2018, there is no interval change  MRI of the cervical spine 12/02/2004 showed a posterior T2 hyperintense focus adjacent to C3.  REVIEW OF SYSTEMS:  Constitutional: No fevers, chills, sweats, or change in appetite.  Notes  Fatigue Eyes: No visual changes, double vision, eye pain Ear, nose and throat: No hearing loss, ear pain, nasal congestion, sore throat Cardiovascular: No chest pain, palpitations Respiratory:  No shortness of breath at rest or with  exertion.   No wheezes GastrointestinaI: No nausea, vomiting, diarrhea, abdominal pain, fecal incontinence Genitourinary:  No dysuria, urinary retention or frequency.  No nocturia. Musculoskeletal:  No neck pain, back pain.   Reports a muscle spasm sensation in the right hip. Integumentary: No rash, pruritus, skin lesions Neurological: as above Psychiatric: Notes  depression at this time.  No anxiety Endocrine: No palpitations, diaphoresis, change in appetite, change in weigh or increased thirst Hematologic/Lymphatic:  No anemia, purpura, petechiae. Allergic/Immunologic: No itchy/runny eyes, nasal congestion, recent allergic reactions, rashes  ALLERGIES: No Known Allergies  HOME MEDICATIONS:  Current Outpatient Medications:    amphetamine-dextroamphetamine (ADDERALL) 10 MG tablet, Take 1 tablet (10 mg total) by mouth 3 (three) times daily as needed., Disp: 90 tablet, Rfl: 0   cholecalciferol (VITAMIN D) 1000 units tablet, Take 5,000 Units by mouth daily., Disp: , Rfl:    FLUoxetine (PROZAC) 20 MG capsule, Take 1 capsule (20 mg total) by mouth daily., Disp: 90 capsule, Rfl: 3   triamterene-hydrochlorothiazide (DYAZIDE) 37.5-25 MG capsule, Take 1 each (1 capsule total) by mouth daily., Disp: 90 capsule, Rfl: 3   VUMERITY 231 MG CPDR, Take 2 capsules by mouth 2 (two) times daily. (Patient not taking: Reported on 02/21/2023), Disp: 360 capsule, Rfl: 1   PAST MEDICAL HISTORY: Past Medical History:  Diagnosis Date   Depression    MS (multiple sclerosis) ([place])    sees Dr Kerman Passey in [facility]    PAST SURGICAL HISTORY: Past Surgical History:  Procedure Laterality Date   LIPOSUCTION      FAMILY HISTORY: Family History  Problem Relation Age of Onset   Drug abuse Mother        opiod addiction   Arthritis Mother        back pain   Arthritis Father    Arthritis/Rheumatoid Father    Ankylosing spondylitis Father    Seizures Maternal Aunt    Parkinsonism Maternal Grandfather    Diabetes  Maternal Grandfather    Lupus Maternal Aunt    Mental illness Brother        bipolar disorder   Parkinson's disease Paternal Grandmother    Arthritis Sister    Mental illness Sister    Drug abuse Sister    Breast cancer Paternal Aunt     SOCIAL HISTORY:  Social History   Socioeconomic History   Marital status: Married    Spouse name: Jeneen Rinks   Number of children: 1   Years of education: 18   Highest education level: Not on file  Occupational History   Occupation: Education officer, museum  Tobacco Use   Smoking status: Never   Smokeless tobacco: Never  Substance and Sexual Activity   Alcohol use: Yes    Alcohol/week: 1.0 standard drink of alcohol    Types: 1 Standard drinks or equivalent per week   Drug use: No   Sexual activity: Yes    Partners: Male    Birth control/protection: Surgical  Other Topics Concern   Not on  file  Social History Narrative   Lives with husband and son, works as Education officer, museum, no dietary restrictions, exercises intermittently. Wears a seat belt regularly   Social Determinants of Health   Financial Resource Strain: Not on file  Food Insecurity: Not on file  Transportation Needs: Not on file  Physical Activity: Not on file  Stress: Not on file  Social Connections: Not on file  Intimate Partner Violence: Not on file     PHYSICAL EXAM  Vitals:   02/21/23 1512  BP: 134/89  Pulse: 96  Weight: 176 lb 8 oz (80.1 kg)  Height: 5\' 3"  (1.6 m)    Body mass index is 31.27 kg/m.   General: The patient is well-developed and well-nourished and in no acute distress.  There is trace pedal edema.  Neck has good range of motion.  Neurologic Exam  Mental status: The patient is alert and oriented x 3 at the time of the examination. The patient has apparent normal recent and remote memory, with an apparently normal attention span and concentration ability.   Speech is normal.  Cranial nerves: Extraocular movements are full.  Facial strength is normal.   No  dysarthria.   No obvious hearing deficits are noted.  Motor:  There are no fasciculations.  Muscle bulk, tone and strength are normal.  Sensory: She has ntact sensation to touch and vibration in the arms and legs.  Coordination: Cerebellar testing reveals good finger-nose-finger and heel-to-shin bilaterally.  Gait and station: Station is normal.    Gait and tandem gait are normal.  Romberg is negative.  Reflexes: Deep tendon reflexes are mildly increased in the legs.. No ankle clonus.  _____________________________________________  Multiple sclerosis ([place]) - Plan: CD20 B Cells, IgG, IgA, IgM  High risk medication use - Plan: CD20 B Cells, IgG, IgA, IgM  Other fatigue  Anxiety  Pedal edema  1.   She will continue Ocrevus.  We will check some lab work (CD19/CD20 and IgG/IgM. 2.   Stay active and exercise as tolerated.    3.    Continue Adderall.  Continue fluoxetine 4.    Return in 6 months or sooner if there are new or worsening neurologic symptoms.   Thurley Francesconi A. Felecia Shelling, MD, PhD [id number], [id number] PM Certified in Neurology, Clinical Neurophysiology, Sleep Medicine, Pain Medicine and Neuroimaging  [facility] [address], [place], [place] [address] [phone]

## 2023-02-22 LAB — CD20 B CELLS
% CD19-B Cells: 0.2 % — ABNORMAL LOW (ref 4.6–22.1)
% CD20-B Cells: 0.1 % — ABNORMAL LOW (ref 5.0–22.3)

## 2023-02-22 LAB — IGG, IGA, IGM
IgA/Immunoglobulin A, Serum: 222 mg/dL (ref 87–352)
IgG (Immunoglobin G), Serum: 1077 mg/dL (ref 586–1602)
IgM (Immunoglobulin M), Srm: 164 mg/dL (ref 26–217)

## 2023-03-04 ENCOUNTER — Telehealth: Payer: Self-pay | Admitting: Neurology

## 2023-03-04 NOTE — Telephone Encounter (Signed)
Dillon Bjork: [id number] exp. 03/04/23-05/02/23 for GI

## 2023-03-10 ENCOUNTER — Other Ambulatory Visit: Payer: BC Managed Care – PPO

## 2023-03-26 ENCOUNTER — Ambulatory Visit
Admission: RE | Admit: 2023-03-26 | Discharge: 2023-03-26 | Disposition: A | Discharge status: Home / Self Care | Payer: BC Managed Care – PPO | Source: Ambulatory Visit | Attending: Neurology | Admitting: Neurology

## 2023-03-26 DIAGNOSIS — G35 Multiple sclerosis: Secondary | ICD-10-CM

## 2023-03-26 MED ORDER — GADOPICLENOL 0.5 MMOL/ML IV SOLN
8.0000 mL | Freq: Once | INTRAVENOUS | Status: AC | PRN
  Administered 2023-03-26: 8 mL via INTRAVENOUS

## 2023-03-27 ENCOUNTER — Other Ambulatory Visit: Payer: Self-pay | Admitting: Neurology

## 2023-04-16 ENCOUNTER — Other Ambulatory Visit (HOSPITAL_BASED_OUTPATIENT_CLINIC_OR_DEPARTMENT_OTHER): Payer: Self-pay

## 2023-04-16 ENCOUNTER — Encounter: Payer: Self-pay | Admitting: Neurology

## 2023-04-16 MED ORDER — AMPHETAMINE-DEXTROAMPHETAMINE 10 MG PO TABS
10.0000 mg | ORAL_TABLET | Freq: Three times a day (TID) | ORAL | 0 refills | Status: DC | PRN
  Filled 2023-04-16: qty 90, 30d supply, fill #0

## 2023-04-16 NOTE — Telephone Encounter (Signed)
Pt last seen on 02/21/23   Follow up scheduled on 08/28/23  Last filled on 02/19/23 #90 tablets (30 day supply)  Rx pending to be signed

## 2023-04-27 ENCOUNTER — Other Ambulatory Visit: Payer: Self-pay | Admitting: Neurology

## 2023-04-30 NOTE — Telephone Encounter (Signed)
Pt last seen on 02/21/23 per note "Continue Adderall. Continue fluoxetine " Follow up scheduled on 08/28/23

## 2023-05-27 ENCOUNTER — Other Ambulatory Visit: Payer: Self-pay | Admitting: *Deleted

## 2023-05-27 ENCOUNTER — Other Ambulatory Visit (HOSPITAL_BASED_OUTPATIENT_CLINIC_OR_DEPARTMENT_OTHER): Payer: Self-pay

## 2023-05-27 ENCOUNTER — Encounter: Payer: Self-pay | Admitting: Neurology

## 2023-05-27 MED ORDER — AMPHETAMINE-DEXTROAMPHETAMINE 10 MG PO TABS
10.0000 mg | ORAL_TABLET | Freq: Three times a day (TID) | ORAL | 0 refills | Status: DC | PRN
  Filled 2023-05-27: qty 90, 30d supply, fill #0

## 2023-05-27 NOTE — Telephone Encounter (Signed)
Pt sent mychart asking for refill of adderall 10mg . Last seen 02/21/23 and next f/u 08/28/23.  Last refilled 04/18/23 #90.

## 2023-05-28 ENCOUNTER — Other Ambulatory Visit: Payer: Self-pay

## 2023-06-24 ENCOUNTER — Other Ambulatory Visit: Payer: BC Managed Care – PPO

## 2023-08-02 ENCOUNTER — Other Ambulatory Visit (HOSPITAL_BASED_OUTPATIENT_CLINIC_OR_DEPARTMENT_OTHER): Payer: Self-pay

## 2023-08-02 ENCOUNTER — Encounter: Payer: Self-pay | Admitting: Neurology

## 2023-08-02 ENCOUNTER — Other Ambulatory Visit: Payer: Self-pay

## 2023-08-02 MED ORDER — AMPHETAMINE-DEXTROAMPHETAMINE 10 MG PO TABS
10.0000 mg | ORAL_TABLET | Freq: Three times a day (TID) | ORAL | 0 refills | Status: DC | PRN
  Filled 2023-08-02: qty 90, 30d supply, fill #0

## 2023-08-22 ENCOUNTER — Encounter: Payer: Self-pay | Admitting: Neurology

## 2023-08-26 ENCOUNTER — Other Ambulatory Visit: Payer: Self-pay | Admitting: Neurology

## 2023-08-26 DIAGNOSIS — G35 Multiple sclerosis: Secondary | ICD-10-CM

## 2023-08-26 DIAGNOSIS — Z79899 Other long term (current) drug therapy: Secondary | ICD-10-CM

## 2023-08-28 ENCOUNTER — Ambulatory Visit: Payer: BC Managed Care – PPO | Admitting: Neurology

## 2023-10-07 ENCOUNTER — Other Ambulatory Visit: Payer: Self-pay

## 2023-10-07 ENCOUNTER — Other Ambulatory Visit: Payer: Self-pay | Admitting: *Deleted

## 2023-10-07 ENCOUNTER — Other Ambulatory Visit (INDEPENDENT_AMBULATORY_CARE_PROVIDER_SITE_OTHER): Payer: Self-pay

## 2023-10-07 DIAGNOSIS — Z79899 Other long term (current) drug therapy: Secondary | ICD-10-CM

## 2023-10-07 DIAGNOSIS — Z0289 Encounter for other administrative examinations: Secondary | ICD-10-CM

## 2023-10-07 DIAGNOSIS — G35 Multiple sclerosis: Secondary | ICD-10-CM

## 2023-10-09 LAB — CBC WITH DIFFERENTIAL/PLATELET
Basophils Absolute: 0.1 10*3/uL (ref 0.0–0.2)
Basos: 1 %
EOS (ABSOLUTE): 0.1 10*3/uL (ref 0.0–0.4)
Eos: 2 %
Hematocrit: 41.3 % (ref 34.0–46.6)
Hemoglobin: 14.3 g/dL (ref 11.1–15.9)
Immature Grans (Abs): 0 10*3/uL (ref 0.0–0.1)
Immature Granulocytes: 0 %
Lymphocytes Absolute: 1 10*3/uL (ref 0.7–3.1)
Lymphs: 14 %
MCH: 34.6 pg — ABNORMAL HIGH (ref 26.6–33.0)
MCHC: 34.6 g/dL (ref 31.5–35.7)
MCV: 100 fL — ABNORMAL HIGH (ref 79–97)
Monocytes Absolute: 0.5 10*3/uL (ref 0.1–0.9)
Monocytes: 7 %
Neutrophils Absolute: 5.3 10*3/uL (ref 1.4–7.0)
Neutrophils: 76 %
Platelets: 331 10*3/uL (ref 150–450)
RBC: 4.13 x10E6/uL (ref 3.77–5.28)
RDW: 13.2 % (ref 11.7–15.4)
WBC: 7 10*3/uL (ref 3.4–10.8)

## 2023-10-09 LAB — IGG, IGA, IGM
IgA/Immunoglobulin A, Serum: 216 mg/dL (ref 87–352)
IgG (Immunoglobin G), Serum: 1048 mg/dL (ref 586–1602)
IgM (Immunoglobulin M), Srm: 137 mg/dL (ref 26–217)

## 2023-10-17 ENCOUNTER — Other Ambulatory Visit (HOSPITAL_BASED_OUTPATIENT_CLINIC_OR_DEPARTMENT_OTHER): Payer: Self-pay

## 2023-10-17 ENCOUNTER — Encounter: Payer: Self-pay | Admitting: Neurology

## 2023-10-17 MED ORDER — AMPHETAMINE-DEXTROAMPHETAMINE 10 MG PO TABS
10.0000 mg | ORAL_TABLET | Freq: Three times a day (TID) | ORAL | 0 refills | Status: DC | PRN
  Filled 2023-10-17: qty 90, 30d supply, fill #0

## 2023-10-17 NOTE — Telephone Encounter (Signed)
Last fill 08-02-2023 #90.  Last seen 02-21-2023,  next appt 02-26-2024

## 2023-10-26 ENCOUNTER — Other Ambulatory Visit: Payer: Self-pay | Admitting: Neurology

## 2023-10-28 NOTE — Telephone Encounter (Signed)
Last seen on 02/21/23 Follow up scheduled on 02/26/24

## 2023-12-10 ENCOUNTER — Other Ambulatory Visit (HOSPITAL_BASED_OUTPATIENT_CLINIC_OR_DEPARTMENT_OTHER): Payer: Self-pay

## 2023-12-10 ENCOUNTER — Encounter: Payer: Self-pay | Admitting: Neurology

## 2023-12-10 MED ORDER — AMPHETAMINE-DEXTROAMPHETAMINE 10 MG PO TABS
10.0000 mg | ORAL_TABLET | Freq: Three times a day (TID) | ORAL | 0 refills | Status: DC | PRN
  Filled 2023-12-10: qty 90, 30d supply, fill #0

## 2023-12-10 NOTE — Telephone Encounter (Signed)
 Last seen on 02/21/23 Follow up scheduled on 02/26/24 Last filled on 10/21/23 #90 tablets (30 day supply) Rx pending to be signed

## 2024-01-22 ENCOUNTER — Encounter: Payer: Self-pay | Admitting: Neurology

## 2024-01-23 ENCOUNTER — Ambulatory Visit: Payer: BC Managed Care – PPO | Admitting: Neurology

## 2024-01-29 ENCOUNTER — Other Ambulatory Visit (HOSPITAL_BASED_OUTPATIENT_CLINIC_OR_DEPARTMENT_OTHER): Payer: Self-pay

## 2024-01-29 ENCOUNTER — Ambulatory Visit (INDEPENDENT_AMBULATORY_CARE_PROVIDER_SITE_OTHER): Payer: BC Managed Care – PPO | Admitting: Neurology

## 2024-01-29 ENCOUNTER — Encounter: Payer: Self-pay | Admitting: Neurology

## 2024-01-29 VITALS — BP 141/91 | HR 118 | Ht 64.0 in | Wt 181.0 lb

## 2024-01-29 DIAGNOSIS — G35 Multiple sclerosis: Secondary | ICD-10-CM

## 2024-01-29 DIAGNOSIS — K5900 Constipation, unspecified: Secondary | ICD-10-CM | POA: Diagnosis not present

## 2024-01-29 DIAGNOSIS — Z79899 Other long term (current) drug therapy: Secondary | ICD-10-CM

## 2024-01-29 DIAGNOSIS — R5383 Other fatigue: Secondary | ICD-10-CM | POA: Diagnosis not present

## 2024-01-29 MED ORDER — AMPHETAMINE-DEXTROAMPHETAMINE 10 MG PO TABS
10.0000 mg | ORAL_TABLET | Freq: Three times a day (TID) | ORAL | 0 refills | Status: DC | PRN
  Filled 2024-01-29 – 2024-02-12 (×2): qty 90, 30d supply, fill #0

## 2024-01-29 MED ORDER — LINACLOTIDE 72 MCG PO CAPS
72.0000 ug | ORAL_CAPSULE | Freq: Every day | ORAL | 3 refills | Status: DC
  Filled 2024-01-29: qty 90, 90d supply, fill #0

## 2024-01-29 NOTE — Progress Notes (Signed)
 [facility]  PATIENT: Laurie Cole DOB: August 08, 1976  REFERRING CLINICIAN: PCP is Danise Edge   REASON FOR VISIT: MS   HISTORICAL  CHIEF COMPLAINT:  Chief Complaint  Patient presents with   Multiple Sclerosis    Rm10, alone, Ms: just reported fatigue but no other symptoms    HISTORY OF PRESENT ILLNESS:  Laurie Cole is a 48 y.o. woman with relapsing remitting MS diagnosed in 2006.     Update 01/29/2024 She has been on Ocrevus and first dose was Aprl 2023 and next dose will be in about a month.  She had been on Vumerity but had low lymphocyte counts.  Even on one half dose lymphocyte counts were low normal prompting the change in therapy   She had done well in the past on Tysabri but unfortunately became JCV antibody positive  She denies any new physical neurologic symptoms or recent exacerbation.   MRIs have been stable (last one 03/26/2023). Next MRi next month.     Brain volume is good.   The MRIs show predominantly periventricular foci with just a couple juxtacortical foci.   No brainstem or cerebellar lesions.  In the past MRI has shown a cervical spine lesion at C3  She reports that her gait, strength, sensation are doing well.   She had one fall when she tripped over a dog leash.   Her fatigue and Attention deficit have done well on Adderall.  Mood is doing well on fluoxetine.    She is sleeping well with a very regular schedule  She had one episode of incontinence right after she fell. .  No urgency in general.  She gets some migraines using frontal and sometimes affecting vision a liittle.  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        MS History:  She presented with a Lhermite sign, visual changes and numbness in 2005. MRIs were performed that were consistent with multiple sclerosis.  Additionally, visual evoked potentials showed slowing of the P100. She was placed on Betaseron. She stopped when she was pregnant about 2 years later. She returned to Betaseron for about another year and one half after her son was born. Then she had some breakthrough relapses and she was switched to Tysabri and tolerated it very well. Unfortunately, after a couple years she converted from negative to positive at a high titer.   She did not have any exacerbations while on Tysabri. She then switched to Tecfidera around 2014.  Initially she had some GI issues as she had persistent flushing.  Therefore, in 2019 she switched to Vumerity and the flushing improved.  However, due to low lymphocytes the dose had to be reduced in 2020  Switched to [facility] 03/2022.  Imaging: MRI of the brain 01/28/2022 shows  Multiple T2/FLAIR hyperintense foci in the periventricular, juxtacortical and deep white matter of the hemispheres.  Foci are also noted in the right cerebellar hemisphere adjacent to the fourth ventricle and to the right within the pons.  The pattern is consistent with chronic demyelinating plaque associated with multiple sclerosis.  None of the foci enhanced or appear to be acute.  Compared to the MRI from 01/10/2021, there were no new lesions.  MRI of the brain 11/24/2018 shows T2/flair hyperintense foci in the hemispheres, right cerebellar hemisphere and possibly the right pons  in a pattern and configuration consistent with chronic demyelinating plaque associated with multiple sclerosis.  None of the foci appears to be acute and they do not enhance.  When compared to the MRI dated 01/25/2018, there is no interval change  MRI of the cervical spine 12/02/2004 showed a posterior T2 hyperintense focus adjacent to C3.  REVIEW OF SYSTEMS:  Constitutional: No fevers, chills, sweats, or change in appetite.  Notes  Fatigue Eyes: No visual changes, double vision, eye pain Ear, nose and throat: No hearing loss, ear pain,  nasal congestion, sore throat Cardiovascular: No chest pain, palpitations Respiratory:  No shortness of breath at rest or with exertion.   No wheezes GastrointestinaI: No nausea, vomiting, diarrhea, abdominal pain, fecal incontinence Genitourinary:  No dysuria, urinary retention or frequency.  No nocturia. Musculoskeletal:  No neck pain, back pain.   Reports a muscle spasm sensation in the right hip. Integumentary: No rash, pruritus, skin lesions Neurological: as above Psychiatric: Notes  depression at this time.  No anxiety Endocrine: No palpitations, diaphoresis, change in appetite, change in weigh or increased thirst Hematologic/Lymphatic:  No anemia, purpura, petechiae. Allergic/Immunologic: No itchy/runny eyes, nasal congestion, recent allergic reactions, rashes  ALLERGIES: No Known Allergies  HOME MEDICATIONS:  Current Outpatient Medications:    amphetamine-dextroamphetamine (ADDERALL) 10 MG tablet, Take 1 tablet (10 mg total) by mouth 3 (three) times daily as needed., Disp: 90 tablet, Rfl: 0   cholecalciferol (VITAMIN D) 1000 units tablet, Take 5,000 Units by mouth daily., Disp: , Rfl:    FLUoxetine (PROZAC) 20 MG capsule, Take 1 capsule by mouth once daily, Disp: 90 capsule, Rfl: 0   triamterene-hydrochlorothiazide (DYAZIDE) 37.5-25 MG capsule, TAKE 1 EACH (1 CAPSULE TOTAL) BY MOUTH DAILY., Disp: 90 capsule, Rfl: 3   PAST MEDICAL HISTORY: Past Medical History:  Diagnosis Date   Depression    MS (multiple sclerosis) (HCC)    sees Dr Leilani Merl in [facility]    PAST SURGICAL HISTORY: Past Surgical History:  Procedure Laterality Date   LIPOSUCTION      FAMILY HISTORY: Family History  Problem Relation Age of Onset   Drug abuse Mother        opiod addiction   Arthritis Mother        back pain   Arthritis Father    Arthritis/Rheumatoid Father    Ankylosing spondylitis Father    Seizures Maternal Aunt    Parkinsonism Maternal Grandfather    Diabetes Maternal Grandfather     Lupus Maternal Aunt    Mental illness Brother        bipolar disorder   Parkinson's disease Paternal Grandmother    Arthritis Sister    Mental illness Sister    Drug abuse Sister    Breast cancer Paternal Aunt     SOCIAL HISTORY:  Social History   Socioeconomic History   Marital status: Married    Spouse name: Fayrene Fearing   Number of children: 1   Years of education: 18   Highest education level: Not on file  Occupational History   Occupation: Child psychotherapist  Tobacco Use   Smoking status: Never   Smokeless tobacco: Never  Substance and Sexual Activity   Alcohol use: Yes    Alcohol/week: 1.0 standard drink of alcohol    Types: 1 Standard drinks or equivalent per week   Drug use: No   Sexual activity: Yes    Partners: Male    Birth control/protection: Surgical  Other Topics Concern   Not on file  Social History  Narrative   Lives with husband and son, works as Child psychotherapist, no dietary restrictions, exercises intermittently. Wears a seat belt regularly   Social Drivers of Corporate investment banker Strain: Not on file  Food Insecurity: Not on file  Transportation Needs: Not on file  Physical Activity: Not on file  Stress: Not on file  Social Connections: Not on file  Intimate Partner Violence: Not on file     PHYSICAL EXAM  Vitals:   01/29/24 1106  BP: (!) 141/91  Pulse: (!) 118  Weight: 181 lb (82.1 kg)  Height: 5\' 4"  (1.626 m)     Body mass index is 31.07 kg/m.   General: The patient is well-developed and well-nourished and in no acute distress.  There is trace pedal edema.  Neck has good range of motion.  Neurologic Exam  Mental status: The patient is alert and oriented x 3 at the time of the examination. The patient has apparent normal recent and remote memory, with an apparently normal attention span and concentration ability.   Speech is normal.  Cranial nerves: Extraocular movements are full.  Facial strength is normal.   No dysarthria.   No  obvious hearing deficits are noted.  Motor:  There are no fasciculations.  Muscle bulk, tone and strength are normal.  Sensory: She has ntact sensation to touch and vibration in the arms and legs.  Coordination: Cerebellar testing reveals good finger-nose-finger and heel-to-shin bilaterally.  Gait and station: Station is normal.   Gait and tandem gait are normal.  Romberg is negative.  Reflexes: Deep tendon reflexes are mildly increased in the legs.. No ankle clonus.  _____________________________________________  Multiple sclerosis (HCC)  High risk medication use  Other fatigue  Constipation, unspecified constipation type  1.   She will continue Ocrevus.  We will check some lab work (CD19/CD20) and IgG/IgM. 2.   Stay active and exercise as tolerated.    3.    Continue Adderall.  Continue fluoxetine 4.    Return in 6 months or sooner if there are new or worsening neurologic symptoms.   Kendale Rembold A. Epimenio Foot, MD, PhD 01/29/2024, 11:24 AM Certified in Neurology, Clinical Neurophysiology, Sleep Medicine, Pain Medicine and Neuroimaging  [facility] [address] [place], [place] [address] [phone]

## 2024-01-31 LAB — IGG, IGA, IGM
IgA/Immunoglobulin A, Serum: 196 mg/dL (ref 87–352)
IgG (Immunoglobin G), Serum: 1000 mg/dL (ref 586–1602)
IgM (Immunoglobulin M), Srm: 133 mg/dL (ref 26–217)

## 2024-01-31 LAB — CD20 B CELLS
% CD19-B Cells: 0 % — ABNORMAL LOW (ref 4.6–22.1)
% CD20-B Cells: 0 % — ABNORMAL LOW (ref 5.0–22.3)

## 2024-02-04 ENCOUNTER — Encounter: Payer: Self-pay | Admitting: Neurology

## 2024-02-06 ENCOUNTER — Other Ambulatory Visit (HOSPITAL_BASED_OUTPATIENT_CLINIC_OR_DEPARTMENT_OTHER): Payer: Self-pay

## 2024-02-10 ENCOUNTER — Other Ambulatory Visit (HOSPITAL_BASED_OUTPATIENT_CLINIC_OR_DEPARTMENT_OTHER): Payer: Self-pay

## 2024-02-12 ENCOUNTER — Ambulatory Visit (INDEPENDENT_AMBULATORY_CARE_PROVIDER_SITE_OTHER)

## 2024-02-12 ENCOUNTER — Other Ambulatory Visit (HOSPITAL_BASED_OUTPATIENT_CLINIC_OR_DEPARTMENT_OTHER): Payer: Self-pay

## 2024-02-12 DIAGNOSIS — G35 Multiple sclerosis: Secondary | ICD-10-CM | POA: Diagnosis not present

## 2024-02-12 MED ORDER — GADOBENATE DIMEGLUMINE 529 MG/ML IV SOLN
15.0000 mL | Freq: Once | INTRAVENOUS | Status: AC | PRN
  Administered 2024-02-12: 15 mL via INTRAVENOUS

## 2024-02-13 ENCOUNTER — Encounter: Payer: Self-pay | Admitting: Neurology

## 2024-02-26 ENCOUNTER — Ambulatory Visit: Payer: BC Managed Care – PPO | Admitting: Neurology

## 2024-03-15 ENCOUNTER — Other Ambulatory Visit: Payer: Self-pay | Admitting: Neurology

## 2024-03-16 MED ORDER — FLUOXETINE HCL 20 MG PO CAPS: 20.0000 mg | ORAL_CAPSULE | Freq: Every day | ORAL | 0 refills | Status: DC

## 2024-03-16 NOTE — Telephone Encounter (Signed)
 Last seen on 01/29/24 No 6 month follow up scheduled

## 2024-04-22 ENCOUNTER — Encounter: Payer: Self-pay | Admitting: Neurology

## 2024-04-22 ENCOUNTER — Other Ambulatory Visit: Payer: Self-pay

## 2024-04-22 NOTE — Telephone Encounter (Signed)
 Pt Last Seen 01/29/2024 No Upcoming Appointments  Adderall Last Filled 02/12/2024 (30 day)

## 2024-04-23 ENCOUNTER — Other Ambulatory Visit (HOSPITAL_BASED_OUTPATIENT_CLINIC_OR_DEPARTMENT_OTHER): Payer: Self-pay

## 2024-04-23 MED ORDER — AMPHETAMINE-DEXTROAMPHETAMINE 10 MG PO TABS
10.0000 mg | ORAL_TABLET | Freq: Three times a day (TID) | ORAL | 0 refills | Status: DC | PRN
  Filled 2024-04-23: qty 90, 30d supply, fill #0

## 2024-06-15 ENCOUNTER — Other Ambulatory Visit (HOSPITAL_BASED_OUTPATIENT_CLINIC_OR_DEPARTMENT_OTHER): Payer: Self-pay

## 2024-06-15 ENCOUNTER — Encounter: Payer: Self-pay | Admitting: Neurology

## 2024-06-15 ENCOUNTER — Other Ambulatory Visit: Payer: Self-pay | Admitting: *Deleted

## 2024-06-15 MED ORDER — AMPHETAMINE-DEXTROAMPHETAMINE 10 MG PO TABS
10.0000 mg | ORAL_TABLET | Freq: Three times a day (TID) | ORAL | 0 refills | Status: DC | PRN
  Filled 2024-06-15: qty 90, 30d supply, fill #0

## 2024-06-15 NOTE — Telephone Encounter (Signed)
 Last seen 01/29/24 and next f/u 08/10/24. Last refilled 04/23/24 #90.

## 2024-06-18 ENCOUNTER — Other Ambulatory Visit (HOSPITAL_BASED_OUTPATIENT_CLINIC_OR_DEPARTMENT_OTHER): Payer: Self-pay

## 2024-07-30 ENCOUNTER — Other Ambulatory Visit: Payer: Self-pay | Admitting: Neurology

## 2024-07-30 ENCOUNTER — Other Ambulatory Visit: Payer: Self-pay | Admitting: *Deleted

## 2024-07-30 ENCOUNTER — Other Ambulatory Visit (HOSPITAL_BASED_OUTPATIENT_CLINIC_OR_DEPARTMENT_OTHER): Payer: Self-pay

## 2024-07-30 ENCOUNTER — Encounter: Payer: Self-pay | Admitting: Neurology

## 2024-07-30 MED ORDER — AMPHETAMINE-DEXTROAMPHETAMINE 10 MG PO TABS
10.0000 mg | ORAL_TABLET | Freq: Three times a day (TID) | ORAL | 0 refills | Status: DC | PRN
  Filled 2024-07-30: qty 90, 30d supply, fill #0

## 2024-07-30 NOTE — Telephone Encounter (Signed)
 Pt sent mychart asking for refill on adderall. Last seen 01/29/24 and next f/u 08/10/24. Last refilled 06/19/24 #90.

## 2024-08-09 NOTE — Progress Notes (Addendum)
 [facility]  PATIENT: Laurie Cole DOB: 1976-10-20  REFERRING CLINICIAN: PCP is Harlene Horton   REASON FOR VISIT: MS   HISTORICAL  CHIEF COMPLAINT:  Chief Complaint  Patient presents with   RM10/MS    Pt is here Alone. Pt states she has been stable since her last appointment.     HISTORY OF PRESENT ILLNESS:  Laurie Cole is a 48 y.o. woman with relapsing remitting MS diagnosed in 2006.     Update  08/10/2024 She has been on Ocrevus and first dose was Aprl 2023 and next dose will be in about a month.  She had been on Vumerity  but had low lymphocyte counts.  Even on one half dose lymphocyte counts were low normal prompting the change in therapy   She had done well in the past on Tysabri but unfortunately became JCV antibody positive.   No infections  She denies any exacerbations or new physical neurologic symptoms.  MRI of the brain on 02/12/2024 showed no new lesions compared to the MRI from 2024.  She denies any new physical neurologic symptoms or recent exacerbation.    The MRIs show predominantly periventricular foci with just a couple juxtacortical foci.   Brain volume is normal.  No brainstem or cerebellar lesions.  In the past MRI has shown a cervical spine lesion at C3  She reports that her gait, strength, sensation are doing well.   She had one fall when she tripped over a dog leash.   Bladder function is fine.  Rare stress leakage.     Vision is fine.    Fatigue and ADD are both improved with Adderall.  Mood is doing well on fluoxetine .    She is sleeping well with a very regular schedule   She gets some migraines using frontal and sometimes affecting vision a liittle.  She takes pseudoephedrine and Aleve,  Only 1/month most months.     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       She is trying to increase exercise and has lost a few pounds.   Used to jog and  hopes to return to that.     MS History:  She presented with a Lhermite sign, visual changes and numbness in 2005. MRIs were performed that were consistent with multiple sclerosis. Additionally, visual evoked potentials showed slowing of the P100. She was placed on Betaseron. She stopped when she was pregnant about 2 years later. She returned to Betaseron for about another year and one half after her son was born. Then she had some breakthrough relapses and she was switched to Tysabri and tolerated it very well. Unfortunately, after a couple years she converted from negative to positive at a high titer.   She did not have any exacerbations while on Tysabri. She then switched to Tecfidera  around 2014.  Initially she had some GI issues as she had persistent flushing.  Therefore, in 2019 she switched to Vumerity  and the flushing improved.  However, due to low lymphocytes the dose had to be reduced in 2020  Switched to Ocrevus 03/2022.  Imaging: MRI of the brain 01/28/2022 shows  Multiple T2/FLAIR hyperintense foci in the periventricular, juxtacortical and deep white matter of the hemispheres.  Foci are also noted in the right cerebellar hemisphere adjacent to the fourth ventricle and to the right within the pons.  The pattern is consistent with chronic demyelinating plaque  associated with multiple sclerosis.  None of the foci enhanced or appear to be acute.  Compared to the MRI from 01/10/2021, there were no new lesions.  MRI of the brain 11/24/2018 shows T2/flair hyperintense foci in the hemispheres, right cerebellar hemisphere and possibly the right pons in a pattern and configuration consistent with chronic demyelinating plaque associated with multiple sclerosis.  None of the foci appears to be acute and they do not enhance.  When compared to the MRI dated 01/25/2018, there is no interval change  MRI of the cervical spine 12/02/2004 showed a posterior T2 hyperintense focus adjacent to C3.  REVIEW OF SYSTEMS:   Constitutional: No fevers, chills, sweats, or change in appetite.  Notes  Fatigue Eyes: No visual changes, double vision, eye pain Ear, nose and throat: No hearing loss, ear pain, nasal congestion, sore throat Cardiovascular: No chest pain, palpitations Respiratory:  No shortness of breath at rest or with exertion.   No wheezes GastrointestinaI: No nausea, vomiting, diarrhea, abdominal pain, fecal incontinence Genitourinary:  No dysuria, urinary retention or frequency.  No nocturia. Musculoskeletal:  No neck pain, back pain.   Reports a muscle spasm sensation in the right hip. Integumentary: No rash, pruritus, skin lesions Neurological: as above Psychiatric: Notes  depression at this time.  No anxiety Endocrine: No palpitations, diaphoresis, change in appetite, change in weigh or increased thirst Hematologic/Lymphatic:  No anemia, purpura, petechiae. Allergic/Immunologic: No itchy/runny eyes, nasal congestion, recent allergic reactions, rashes  ALLERGIES: No Known Allergies  HOME MEDICATIONS:  Current Outpatient Medications:    amphetamine -dextroamphetamine  (ADDERALL) 10 MG tablet, Take 1 tablet (10 mg total) by mouth 3 (three) times daily as needed., Disp: 90 tablet, Rfl: 0   cholecalciferol (VITAMIN D ) 1000 units tablet, Take 5,000 Units by mouth daily., Disp: , Rfl:    linaclotide  (LINZESS ) 72 MCG capsule, Take 1 capsule (72 mcg total) by mouth daily before breakfast., Disp: 90 capsule, Rfl: 3   FLUoxetine  (PROZAC ) 20 MG capsule, Take 1 capsule (20 mg total) by mouth daily., Disp: 90 capsule, Rfl: 1   Multiple Vitamin (MULTIVITAMIN) tablet, Take 1 tablet by mouth daily., Disp: , Rfl:    triamterene -hydrochlorothiazide  (DYAZIDE ) 37.5-25 MG capsule, TAKE 1 EACH (1 CAPSULE TOTAL) BY MOUTH DAILY. (Patient not taking: Reported on 08/10/2024), Disp: 90 capsule, Rfl: 3   PAST MEDICAL HISTORY: Past Medical History:  Diagnosis Date   Depression    MS (multiple sclerosis)    sees Dr Suanne  in [facility]    PAST SURGICAL HISTORY: Past Surgical History:  Procedure Laterality Date   LIPOSUCTION      FAMILY HISTORY: Family History  Problem Relation Age of Onset   Drug abuse Mother        opiod addiction   Arthritis Mother        back pain   Arthritis Father    Arthritis/Rheumatoid Father    Ankylosing spondylitis Father    Seizures Maternal Aunt    Parkinsonism Maternal Grandfather    Diabetes Maternal Grandfather    Lupus Maternal Aunt    Mental illness Brother        bipolar disorder   Parkinson's disease Paternal Grandmother    Arthritis Sister    Mental illness Sister    Drug abuse Sister    Breast cancer Paternal Aunt     SOCIAL HISTORY:  Social History   Socioeconomic History   Marital status: Married    Spouse name: Lynwood   Number of children: 1   Years of  education: 18   Highest education level: Not on file  Occupational History   Occupation: Child psychotherapist  Tobacco Use   Smoking status: Never   Smokeless tobacco: Never  Substance and Sexual Activity   Alcohol use: Yes    Alcohol/week: 1.0 standard drink of alcohol    Types: 1 Standard drinks or equivalent per week   Drug use: No   Sexual activity: Yes    Partners: Male    Birth control/protection: Surgical  Other Topics Concern   Not on file  Social History Narrative   Lives with husband and son, works as Child psychotherapist, no dietary restrictions, exercises intermittently. Wears a seat belt regularly   Social Drivers of Corporate investment banker Strain: Not on file  Food Insecurity: Not on file  Transportation Needs: Not on file  Physical Activity: Not on file  Stress: Not on file  Social Connections: Not on file  Intimate Partner Violence: Not on file     PHYSICAL EXAM  Vitals:   08/10/24 1401  BP: 117/79  Pulse: 84  SpO2: 99%  Weight: 174 lb 8 oz (79.2 kg)  Height: 5' 4 (1.626 m)      Body mass index is 29.95 kg/m.   General: The patient is well-developed and  well-nourished and in no acute distress.  There is trace pedal edema.  Neck has good range of motion.  Neurologic Exam  Mental status: The patient is alert and oriented x 3 at the time of the examination. The patient has apparent normal recent and remote memory, with an apparently normal attention span and concentration ability.   Speech is normal.  Cranial nerves: Extraocular movements are full.  Facial strength is normal.   No dysarthria.   No obvious hearing deficits are noted.  Motor:  There are no fasciculations.  Muscle bulk, tone and strength are normal.  Sensory: She has ntact sensation to touch and vibration in the arms and legs.  Coordination: Cerebellar testing reveals good finger-nose-finger and heel-to-shin bilaterally.  Gait and station: Station is normal.  The gait and tandem gait are normal.  Romberg is negative.  Reflexes: Deep tendon reflexes are mildly increased in the legs.. No ankle clonus.  _____________________________________________  Relapsing remitting multiple sclerosis  Multiple sclerosis - Plan: CBC with Differential/Platelet, IgG, IgA, IgM  High risk medication use - Plan: CBC with Differential/Platelet, IgG, IgA, IgM  Other fatigue  Anxiety  1.   Continue Ocrevus for relapsing remitting MS.  We will check some lab work, CBC/D and IgG/IgM.   Will check MRI around the time of the next visit 2.   Stay active and exercise as tolerated.    3.    Continue Adderall.  Continue fluoxetine  4.    Return in 6 months or sooner if there are new or worsening neurologic symptoms.   Tigerlily Christine A. Vear, MD, PhD 09/07/2024, 8:37 AM Certified in Neurology, Clinical Neurophysiology, Sleep Medicine, Pain Medicine and Neuroimaging  [facility] [address] [place], [place] [address] [phone]

## 2024-08-10 ENCOUNTER — Ambulatory Visit (INDEPENDENT_AMBULATORY_CARE_PROVIDER_SITE_OTHER): Admitting: Neurology

## 2024-08-10 ENCOUNTER — Encounter: Payer: Self-pay | Admitting: Neurology

## 2024-08-10 VITALS — BP 117/79 | HR 84 | Ht 64.0 in | Wt 174.5 lb

## 2024-08-10 DIAGNOSIS — Z79899 Other long term (current) drug therapy: Secondary | ICD-10-CM | POA: Diagnosis not present

## 2024-08-10 DIAGNOSIS — F419 Anxiety disorder, unspecified: Secondary | ICD-10-CM | POA: Diagnosis not present

## 2024-08-10 DIAGNOSIS — G35 Multiple sclerosis: Secondary | ICD-10-CM | POA: Diagnosis not present

## 2024-08-10 DIAGNOSIS — G35A Relapsing-remitting multiple sclerosis: Secondary | ICD-10-CM

## 2024-08-10 DIAGNOSIS — R5383 Other fatigue: Secondary | ICD-10-CM | POA: Diagnosis not present

## 2024-08-10 DIAGNOSIS — G35D Multiple sclerosis, unspecified: Secondary | ICD-10-CM

## 2024-08-11 ENCOUNTER — Ambulatory Visit: Payer: Self-pay | Admitting: Neurology

## 2024-08-11 LAB — CBC WITH DIFFERENTIAL/PLATELET
Basophils Absolute: 0.1 x10E3/uL (ref 0.0–0.2)
Basos: 1 %
EOS (ABSOLUTE): 0.1 x10E3/uL (ref 0.0–0.4)
Eos: 2 %
Hematocrit: 40.3 % (ref 34.0–46.6)
Hemoglobin: 13.7 g/dL (ref 11.1–15.9)
Immature Grans (Abs): 0 x10E3/uL (ref 0.0–0.1)
Immature Granulocytes: 0 %
Lymphocytes Absolute: 1 x10E3/uL (ref 0.7–3.1)
Lymphs: 14 %
MCH: 34.3 pg — ABNORMAL HIGH (ref 26.6–33.0)
MCHC: 34 g/dL (ref 31.5–35.7)
MCV: 101 fL — ABNORMAL HIGH (ref 79–97)
Monocytes Absolute: 0.4 x10E3/uL (ref 0.1–0.9)
Monocytes: 6 %
Neutrophils Absolute: 5.7 x10E3/uL (ref 1.4–7.0)
Neutrophils: 77 %
Platelets: 306 x10E3/uL (ref 150–450)
RBC: 4 x10E6/uL (ref 3.77–5.28)
RDW: 13.1 % (ref 11.7–15.4)
WBC: 7.3 x10E3/uL (ref 3.4–10.8)

## 2024-08-11 LAB — IGG, IGA, IGM
IgA/Immunoglobulin A, Serum: 178 mg/dL (ref 87–352)
IgG (Immunoglobin G), Serum: 942 mg/dL (ref 586–1602)
IgM (Immunoglobulin M), Srm: 125 mg/dL (ref 26–217)

## 2024-09-01 ENCOUNTER — Other Ambulatory Visit (HOSPITAL_BASED_OUTPATIENT_CLINIC_OR_DEPARTMENT_OTHER): Payer: Self-pay

## 2024-09-01 ENCOUNTER — Encounter: Payer: Self-pay | Admitting: Neurology

## 2024-09-01 MED ORDER — FLUOXETINE HCL 20 MG PO CAPS
20.0000 mg | ORAL_CAPSULE | Freq: Every day | ORAL | 1 refills | Status: AC
  Filled 2024-09-01: qty 90, 90d supply, fill #0
  Filled 2024-12-02: qty 90, 90d supply, fill #1

## 2024-09-01 NOTE — Telephone Encounter (Signed)
 Last filled by patient on 03/16/2024 Last office visit : 08/10/24 Next office visit : 03/16/25 Per 08/10/24 ov note - Continue fluoxetine 

## 2024-09-07 ENCOUNTER — Encounter: Payer: Self-pay | Admitting: Neurology

## 2024-09-07 ENCOUNTER — Telehealth: Payer: Self-pay | Admitting: *Deleted

## 2024-09-07 NOTE — Telephone Encounter (Signed)
 Gave updated order to [facility]

## 2024-09-07 NOTE — Telephone Encounter (Signed)
 Spoke w/ Silvano. ICD 10 is still G35. Needs to be a specific ICD 10 code for RRMS: G35.A

## 2024-09-07 NOTE — Telephone Encounter (Signed)
 Per Holly/[facility]:  Will need her OVN updated with the new DX code and I will get a new PO for sater to sign

## 2024-09-07 NOTE — Telephone Encounter (Signed)
 Noted. I let [facility] know that MD updated note

## 2024-10-01 ENCOUNTER — Encounter: Payer: Self-pay | Admitting: Neurology

## 2024-10-13 ENCOUNTER — Encounter: Payer: Self-pay | Admitting: Neurology

## 2024-10-13 ENCOUNTER — Other Ambulatory Visit: Payer: Self-pay | Admitting: *Deleted

## 2024-10-13 ENCOUNTER — Other Ambulatory Visit (HOSPITAL_BASED_OUTPATIENT_CLINIC_OR_DEPARTMENT_OTHER): Payer: Self-pay

## 2024-10-13 MED ORDER — AMPHETAMINE-DEXTROAMPHETAMINE 10 MG PO TABS
10.0000 mg | ORAL_TABLET | Freq: Three times a day (TID) | ORAL | 0 refills | Status: DC | PRN
  Filled 2024-10-13: qty 90, 30d supply, fill #0

## 2024-10-13 NOTE — Telephone Encounter (Signed)
 Pt sent mychart asking for refill on adderall. Last seen 08/10/24 and next f/u 03/16/25. Last refilled 08/05/24 #90.

## 2024-12-02 ENCOUNTER — Other Ambulatory Visit: Payer: Self-pay

## 2024-12-02 ENCOUNTER — Other Ambulatory Visit: Payer: Self-pay | Admitting: Medical

## 2024-12-02 ENCOUNTER — Other Ambulatory Visit: Payer: Self-pay | Admitting: Neurology

## 2024-12-02 DIAGNOSIS — N6001 Solitary cyst of right breast: Secondary | ICD-10-CM

## 2024-12-04 ENCOUNTER — Telehealth: Payer: Self-pay | Admitting: *Deleted

## 2024-12-04 NOTE — Telephone Encounter (Signed)
 Copied from CRM [id number]. Topic: Appointments - Scheduling Inquiry for Clinic >> Dec 02, 2024  2:13 PM Alfonso ORN wrote: Reason for CRM: pt hasn't been seen by [facility] for some time and called to schedule new pt visit as she is not on file with a provider any longer  . She would like to keep seeing Harlene Horton noticed she is scheduled already for well check in February . Please contact pt to confirm if ok to keep that appointment to continue care.

## 2024-12-07 ENCOUNTER — Other Ambulatory Visit (HOSPITAL_BASED_OUTPATIENT_CLINIC_OR_DEPARTMENT_OTHER): Payer: Self-pay

## 2024-12-07 MED ORDER — AMPHETAMINE-DEXTROAMPHETAMINE 10 MG PO TABS
10.0000 mg | ORAL_TABLET | Freq: Three times a day (TID) | ORAL | 0 refills | Status: AC | PRN
  Filled 2024-12-07: qty 90, 30d supply, fill #0

## 2024-12-07 NOTE — Telephone Encounter (Signed)
 Last seen on 08/10/24 Follow up scheduled 03/16/25   Dispensed Days Supply Quantity Provider Pharmacy  amphetamine -dextroamphetamine  (ADDERALL) 10 MG tablet 10/16/2024 30 90 tablet Sater, Charlie LABOR, MD [facility] -...      Rx pending to be signed

## 2024-12-18 ENCOUNTER — Ambulatory Visit
Admission: RE | Admit: 2024-12-18 | Discharge: 2024-12-18 | Disposition: A | Discharge status: Home / Self Care | Source: Ambulatory Visit | Attending: Medical | Admitting: Medical

## 2024-12-18 ENCOUNTER — Ambulatory Visit: Payer: Self-pay | Admitting: Medical

## 2024-12-18 DIAGNOSIS — N6001 Solitary cyst of right breast: Secondary | ICD-10-CM

## 2025-01-07 ENCOUNTER — Encounter: Payer: Self-pay | Admitting: Physician Assistant

## 2025-01-07 ENCOUNTER — Ambulatory Visit: Admitting: Family Medicine

## 2025-01-07 ENCOUNTER — Ambulatory Visit: Admitting: Physician Assistant

## 2025-01-07 ENCOUNTER — Ambulatory Visit: Admitting: Student

## 2025-01-07 VITALS — BP 117/77 | HR 104 | Temp 98.5°F | Resp 18 | Ht 64.0 in | Wt 186.6 lb

## 2025-01-07 DIAGNOSIS — Z833 Family history of diabetes mellitus: Secondary | ICD-10-CM | POA: Insufficient documentation

## 2025-01-07 DIAGNOSIS — Z23 Encounter for immunization: Secondary | ICD-10-CM

## 2025-01-07 DIAGNOSIS — Z0000 Encounter for general adult medical examination without abnormal findings: Secondary | ICD-10-CM

## 2025-01-07 DIAGNOSIS — R635 Abnormal weight gain: Secondary | ICD-10-CM | POA: Insufficient documentation

## 2025-01-07 DIAGNOSIS — F411 Generalized anxiety disorder: Secondary | ICD-10-CM | POA: Insufficient documentation

## 2025-01-07 DIAGNOSIS — R5383 Other fatigue: Secondary | ICD-10-CM | POA: Insufficient documentation

## 2025-01-07 DIAGNOSIS — G35D Multiple sclerosis, unspecified: Secondary | ICD-10-CM

## 2025-01-07 NOTE — Assessment & Plan Note (Signed)
-   Weight gain potentially due to hormonal changes and decreased activity. - Discussed lifestyle and dietary modifications.  - Ordered TSH, CBC, CMP, lipid panel, and A1c to evaluate for metabolic dysfunction. - Patient requests referral to weight management clinic. - Follow up after labs to review results and discuss plan.

## 2025-01-07 NOTE — Assessment & Plan Note (Addendum)
-   Well-managed on Prozac . - Consider therapy if symptoms worsen.

## 2025-01-07 NOTE — Telephone Encounter (Signed)
 Spoke with pt and she was advised that a new pt/ acute appointment can be made with Honora Landy RIGGERS today,01/07/25 @ 2:20 PM and then she can schedule a TOC appointment with Harlene Wheeler CHOL.

## 2025-01-07 NOTE — Progress Notes (Signed)
 "  BP 117/77   Pulse (!) 104   Temp 98.5 F (36.9 C)   Resp 18   Ht 5' 4 (1.626 m)   Wt 186 lb 9.6 oz (84.6 kg)   LMP 12/18/2024 (Approximate)   SpO2 100%   BMI 32.03 kg/m    Subjective:    Patient ID: Laurie Cole, female    DOB: 10-01-1976, 49 y.o.   MRN: [id number]  HPI: Laurie Cole is a 49 y.o. female presenting on 01/07/2025 for comprehensive medical examination and new patient examination.  Discussed the use of AI scribe software for clinical note transcription with the patient, who gave verbal consent to proceed.  History of Present Illness Laurie Cole is a 49 year old female with multiple sclerosis who presents for an annual physical exam and concerns about weight gain and fatigue.  She has multiple sclerosis treated with immunosuppressant therapy. She has chronic fatigue managed with Adderall. She notes increased irritability and hormonal changes and is concerned about possible perimenopause. She takes daily vitamin D  and a multivitamin with B12. She has anxiety well controlled on Prozac  for 20 years.  She has had a cough since October that was severe through late fall and is now improved. It is worse in the evenings and has led to stress incontinence which was the most aggravating aspect.   She is concerned about progressive weight gain over the past few years.  She states that she and her husband started a weight loss journey with diet and exercise that was initially successful before being hindered by recent cough.  She is concerned about hormone changes being a factor.  Her family history is notable for diabetes in her maternal grandfather and breast cancer in relatives.    Employment: Conservator, Museum/gallery, therapist. Lives with: Husband and son. Her diet consists of: Moderate. She endorses exercise and/or activity of: 10,000+ steps/day. Recently limited due to cough which has improved.  Dental care: Yes. Vision/Eye care:  Plans to get vision check.  She is currently sexually active with a single partners. She denies concerns today about STI Protection/contraception: No  Alcohol use: Minimal, 1-2 times/month. Nicotine use: No. Illicit drug use: Occasional THC gummy, rare.  The ASCVD Risk score (Arnett DK, et al., 2019) failed to calculate for the following reasons:   Cannot find a previous HDL lab   Cannot find a previous total cholesterol lab   * - Cholesterol units were assumed     01/07/2025    2:35 PM 02/24/2021    8:02 AM  Depression screen PHQ 2/9  Decreased Interest 0 0  Down, Depressed, Hopeless 0 0  PHQ - 2 Score 0 0  Altered sleeping 0   Tired, decreased energy 1   Change in appetite 0   Feeling bad or failure about yourself  0   Trouble concentrating 0   Moving slowly or fidgety/restless 0   Suicidal thoughts 0   PHQ-9 Score 1   Difficult doing work/chores Not difficult at all        01/07/2025    2:36 PM  GAD 7 : Generalized Anxiety Score  Nervous, Anxious, on Edge 1  Control/stop worrying 0  Worry too much - different things 1  Trouble relaxing 0  Restless 0  Easily annoyed or irritable 1  Afraid - awful might happen 0  Total GAD 7 Score 3  Anxiety Difficulty Somewhat difficult       01/07/2025  2:35 PM  Fall Risk  Falls in the past year? 0  Was there an injury with Fall? 0  Fall Risk Category Calculator 0  Patient at Risk for Falls Due to No Fall Risks  Fall risk Follow up Falls evaluation completed      Past Medical History:  Past Medical History:  Diagnosis Date   Depression    MS (multiple sclerosis)    sees Dr Suanne in [facility]    Surgical History:  Past Surgical History:  Procedure Laterality Date   LIPOSUCTION      Medications:  Current Outpatient Medications on File Prior to Visit  Medication Sig   BIOTIN PO Take by mouth.   MAGNESIUM PO Take by mouth.   Probiotic Product (PROBIOTIC DAILY PO) Take by mouth.   amphetamine -dextroamphetamine   (ADDERALL) 10 MG tablet Take 1 tablet (10 mg total) by mouth 3 (three) times daily as needed.   cholecalciferol (VITAMIN D ) 1000 units tablet Take 5,000 Units by mouth daily.   FLUoxetine  (PROZAC ) 20 MG capsule Take 1 capsule (20 mg total) by mouth daily.   Multiple Vitamin (MULTIVITAMIN) tablet Take 1 tablet by mouth daily.   No current facility-administered medications on file prior to visit.    Allergies:  Allergies[1]  Family History:  Family History  Problem Relation Age of Onset   Drug abuse Mother        opiod addiction   Arthritis Mother        back pain   Arthritis Father    Arthritis/Rheumatoid Father    Ankylosing spondylitis Father    Seizures Maternal Aunt    Parkinsonism Maternal Grandfather    Diabetes Maternal Grandfather    Lupus Maternal Aunt    Mental illness Brother        bipolar disorder   Parkinson's disease Paternal Grandmother    Arthritis Sister    Mental illness Sister    Drug abuse Sister    Breast cancer Paternal Aunt     Past medical history, surgical history, medications, allergies, family history and social history reviewed with patient today and changes made to appropriate areas of the chart.   All ROS negative except what is listed above and in the HPI.      Objective:    BP 117/77   Pulse (!) 104   Temp 98.5 F (36.9 C)   Resp 18   Ht 5' 4 (1.626 m)   Wt 186 lb 9.6 oz (84.6 kg)   LMP 12/18/2024 (Approximate)   SpO2 100%   BMI 32.03 kg/m   Wt Readings from Last 3 Encounters:  01/07/25 186 lb 9.6 oz (84.6 kg)  08/10/24 174 lb 8 oz (79.2 kg)  01/29/24 181 lb (82.1 kg)    Physical Exam Constitutional:      Appearance: Normal appearance.  HENT:     Head: Normocephalic and atraumatic.     Right Ear: Tympanic membrane normal.     Left Ear: Tympanic membrane normal.     Mouth/Throat:     Pharynx: Oropharynx is clear.  Eyes:     Extraocular Movements: Extraocular movements intact.     Pupils: Pupils are equal, round, and  reactive to light.  Cardiovascular:     Rate and Rhythm: Normal rate and regular rhythm.     Pulses: Normal pulses.  Pulmonary:     Effort: Pulmonary effort is normal. No respiratory distress.     Breath sounds: Normal breath sounds.  Abdominal:     General:  There is no distension.     Palpations: Abdomen is soft.     Tenderness: There is no abdominal tenderness.  Genitourinary:    Comments: Deferred Musculoskeletal:        General: Normal range of motion.     Cervical back: Normal range of motion.  Lymphadenopathy:     Cervical: No cervical adenopathy.  Skin:    General: Skin is warm.  Neurological:     General: No focal deficit present.     Mental Status: She is alert and oriented to person, place, and time.     Comments: Hyperreflexia  Psychiatric:        Behavior: Behavior normal.       Results for orders placed or performed in visit on 08/10/24  CBC with Differential/Platelet   Collection Time: 08/10/24  2:35 PM  Result Value Ref Range   WBC 7.3 3.4 - 10.8 x10E3/uL   RBC 4.00 3.77 - 5.28 x10E6/uL   Hemoglobin 13.7 11.1 - 15.9 g/dL   Hematocrit 59.6 65.9 - 46.6 %   MCV 101 (H) 79 - 97 fL   MCH 34.3 (H) 26.6 - 33.0 pg   MCHC 34.0 31.5 - 35.7 g/dL   RDW 86.8 88.2 - 84.5 %   Platelets 306 150 - 450 x10E3/uL   Neutrophils 77 Not Estab. %   Lymphs 14 Not Estab. %   Monocytes 6 Not Estab. %   Eos 2 Not Estab. %   Basos 1 Not Estab. %   Neutrophils Absolute 5.7 1.4 - 7.0 x10E3/uL   Lymphocytes Absolute 1.0 0.7 - 3.1 x10E3/uL   Monocytes Absolute 0.4 0.1 - 0.9 x10E3/uL   EOS (ABSOLUTE) 0.1 0.0 - 0.4 x10E3/uL   Basophils Absolute 0.1 0.0 - 0.2 x10E3/uL   Immature Granulocytes 0 Not Estab. %   Immature Grans (Abs) 0.0 0.0 - 0.1 x10E3/uL  IgG, IgA, IgM   Collection Time: 08/10/24  2:35 PM  Result Value Ref Range   IgG (Immunoglobin G), Serum 942 586 - 1,602 mg/dL   Immunoglobulin A, (IgA) QN, Serum 178 87 - 352 mg/dL   IgM (Immunoglobulin M), Srm 125 26 - 217  mg/dL      Assessment & Plan:   Problem List Items Addressed This Visit       Nervous and Auditory   Multiple sclerosis (HCC)   - Managed by Dr. Vear with [facility].        Other   Preventative health care   - Routine health maintenance discussed. Recent mammogram and Pap smear. Discussed colonoscopy screening. Vaccinations discussed. - Administered flu shot. - Referred to gastroenterology for colonoscopy. - Discuss COVID vaccination and pneumococcal immunization at next visit.      Family history of diabetes mellitus (DM)   Relevant Orders   Hemoglobin A1c   GAD (generalized anxiety disorder)   - Well-managed on Prozac . - Consider therapy if symptoms worsen.      Weight gain   - Weight gain potentially due to hormonal changes and decreased activity. - Discussed lifestyle and dietary modifications.  - Ordered TSH, CBC, CMP, lipid panel, and A1c to evaluate for metabolic dysfunction. - Patient requests referral to weight management clinic. - Follow up after labs to review results and discuss plan.      Relevant Orders   TSH   Amb Ref to Medical Weight Management   Fatigue   - Chronic fatigue related to multiple sclerosis, managed with Adderall. Considered thyroid  dysfunction and iron deficiency  as contributing factors. - Ordered TSH and iron panel. - Continue Adderall as needed.      Relevant Orders   Iron, TIBC and Ferritin Panel   B12 and Folate Panel   Other Visit Diagnoses       Need for influenza vaccination    -  Primary   Relevant Orders   Flu vaccine trivalent PF, 6mos and older(Flulaval,Afluria,Fluarix,Fluzone) (Completed)     Encounter for annual physical exam       Relevant Orders   CBC with Differential/Platelet   Comprehensive metabolic panel with GFR   Lipid panel   Ambulatory referral to Gastroenterology        LABORATORY TESTING:  Health maintenance labs ordered today as listed above.   IMMUNIZATIONS:   - Tdap:  Tetanus vaccination status reviewed: Will verify immunization status. - Influenza: Administered today - Pneumovax: Refused - will check with neurologist. - Meningococcal vaccine: Not applicable - HBV: Given elsewhere - Shingrix vaccine: Not applicable - COVID-19: Refused  SCREENING: - Mammogram: Up to date  - DEXA: Not applicable  - Colonoscopy: Ordered today  - AAA Screening: Not applicable  - Lung Cancer Screening: Not applicable   PATIENT COUNSELING:    Diet: Recommend a diet with emphasis on vegetables, fruits, whole grains, legumes, nuts, and lean protein sources such as poultry, fish, and low-fat dairy products while limiting consumption of saturated fats, trans fats, red meats, and added sugars. Recommend restricted caloric intake for those with issues with obesity and increased calories for those with low weight. Sodium intake should be less than 2,400 mg for the average individual and less than 1,500 mg for those who struggle with hypertension.  Exercise: Recommend moderate-high intensity exercise 3-4x/week.   Injury prevention: Recommend the regular practice of wearing seatbelts.  Ensure that smoke detectors are functional and placed appropriately in the home.  Dental health: Recommend regular dental cleanings in addition to brushing and flossing.    Follow up plan:  No follow-ups on file.    [1] No Known Allergies  "

## 2025-01-07 NOTE — Assessment & Plan Note (Signed)
-   Managed by Dr. Vear with [facility].

## 2025-01-07 NOTE — Assessment & Plan Note (Signed)
-   Routine health maintenance discussed. Recent mammogram and Pap smear. Discussed colonoscopy screening. Vaccinations discussed. - Administered flu shot. - Referred to gastroenterology for colonoscopy. - Discuss COVID vaccination and pneumococcal immunization at next visit.

## 2025-01-07 NOTE — Assessment & Plan Note (Signed)
-   Chronic fatigue related to multiple sclerosis, managed with Adderall. Considered thyroid  dysfunction and iron deficiency as contributing factors. - Ordered TSH and iron panel. - Continue Adderall as needed.

## 2025-01-08 ENCOUNTER — Ambulatory Visit: Payer: Self-pay | Admitting: Physician Assistant

## 2025-01-08 LAB — CBC WITH DIFFERENTIAL/PLATELET
Basophils Absolute: 0.1 10*3/uL (ref 0.0–0.1)
Basophils Relative: 0.6 % (ref 0.0–3.0)
Eosinophils Absolute: 0.1 10*3/uL (ref 0.0–0.7)
Eosinophils Relative: 1.4 % (ref 0.0–5.0)
HCT: 41.9 % (ref 36.0–46.0)
Hemoglobin: 14.5 g/dL (ref 12.0–15.0)
Lymphocytes Relative: 16.4 % (ref 12.0–46.0)
Lymphs Abs: 1.4 10*3/uL (ref 0.7–4.0)
MCHC: 34.7 g/dL (ref 30.0–36.0)
MCV: 98.9 fl (ref 78.0–100.0)
Monocytes Absolute: 0.5 10*3/uL (ref 0.1–1.0)
Monocytes Relative: 6 % (ref 3.0–12.0)
Neutro Abs: 6.6 10*3/uL (ref 1.4–7.7)
Neutrophils Relative %: 75.6 % (ref 43.0–77.0)
Platelets: 304 10*3/uL (ref 150.0–400.0)
RBC: 4.23 Mil/uL (ref 3.87–5.11)
RDW: 12.9 % (ref 11.5–15.5)
WBC: 8.8 10*3/uL (ref 4.0–10.5)

## 2025-01-08 LAB — LIPID PANEL
Cholesterol: 213 mg/dL — ABNORMAL HIGH (ref 28–200)
HDL: 56.2 mg/dL
LDL Cholesterol: 129 mg/dL — ABNORMAL HIGH (ref 10–99)
NonHDL: 156.54
Total CHOL/HDL Ratio: 4
Triglycerides: 140 mg/dL (ref 10.0–149.0)
VLDL: 28 mg/dL (ref 0.0–40.0)

## 2025-01-08 LAB — IRON,TIBC AND FERRITIN PANEL
%SAT: 37 % (ref 16–45)
Ferritin: 329 ng/mL — ABNORMAL HIGH (ref 16–232)
Iron: 117 ug/dL (ref 40–190)
TIBC: 317 ug/dL (ref 250–450)

## 2025-01-08 LAB — COMPREHENSIVE METABOLIC PANEL WITH GFR
ALT: 15 U/L (ref 3–35)
AST: 22 U/L (ref 5–37)
Albumin: 4.7 g/dL (ref 3.5–5.2)
Alkaline Phosphatase: 58 U/L (ref 39–117)
BUN: 13 mg/dL (ref 6–23)
CO2: 26 meq/L (ref 19–32)
Calcium: 9.9 mg/dL (ref 8.4–10.5)
Chloride: 103 meq/L (ref 96–112)
Creatinine, Ser: 0.84 mg/dL (ref 0.40–1.20)
GFR: 83.29 mL/min
Glucose, Bld: 95 mg/dL (ref 70–99)
Potassium: 3.7 meq/L (ref 3.5–5.1)
Sodium: 137 meq/L (ref 135–145)
Total Bilirubin: 0.9 mg/dL (ref 0.2–1.2)
Total Protein: 7.2 g/dL (ref 6.0–8.3)

## 2025-01-08 LAB — B12 AND FOLATE PANEL
Folate: >23.4 ng/mL
Vitamin B-12: 506 pg/mL (ref 211–911)

## 2025-01-08 LAB — HEMOGLOBIN A1C: Hgb A1c MFr Bld: 4.7 % (ref 4.6–6.5)

## 2025-01-08 LAB — TSH: TSH: 2.54 u[IU]/mL (ref 0.35–5.50)

## 2025-03-10 ENCOUNTER — Encounter: Admitting: Student

## 2025-03-16 ENCOUNTER — Ambulatory Visit: Admitting: Neurology
